# Patient Record
Sex: Male | Born: 1955
Health system: Southern US, Community
[De-identification: ages and names within clinical notes are randomized; demographics above are authoritative.]

## PROBLEM LIST (undated history)

## (undated) DIAGNOSIS — R12 Heartburn: Secondary | ICD-10-CM

## (undated) DIAGNOSIS — R972 Elevated prostate specific antigen [PSA]: Secondary | ICD-10-CM

## (undated) DIAGNOSIS — C439 Malignant melanoma of skin, unspecified: Secondary | ICD-10-CM

## (undated) DIAGNOSIS — N400 Enlarged prostate without lower urinary tract symptoms: Secondary | ICD-10-CM

## (undated) DIAGNOSIS — R319 Hematuria, unspecified: Secondary | ICD-10-CM

## (undated) DIAGNOSIS — M1990 Unspecified osteoarthritis, unspecified site: Secondary | ICD-10-CM

## (undated) DIAGNOSIS — I10 Essential (primary) hypertension: Secondary | ICD-10-CM

## (undated) HISTORY — DX: Elevated prostate specific antigen (PSA): R97.20

## (undated) HISTORY — DX: Heartburn: R12

## (undated) HISTORY — PX: MOHS SURGERY: SUR867

## (undated) HISTORY — DX: Benign prostatic hyperplasia without lower urinary tract symptoms: N40.0

## (undated) HISTORY — DX: Hematuria, unspecified: R31.9

## (undated) HISTORY — DX: Essential (primary) hypertension: I10

## (undated) HISTORY — DX: Malignant melanoma of skin, unspecified: C43.9

## (undated) HISTORY — DX: Unspecified osteoarthritis, unspecified site: M19.90

---

## 2008-05-09 ENCOUNTER — Ambulatory Visit: Payer: Self-pay | Admitting: Unknown Physician Specialty

## 2012-07-04 DIAGNOSIS — Z85820 Personal history of malignant melanoma of skin: Secondary | ICD-10-CM | POA: Insufficient documentation

## 2012-09-02 ENCOUNTER — Ambulatory Visit: Payer: Self-pay | Admitting: Orthopedic Surgery

## 2013-10-08 ENCOUNTER — Ambulatory Visit: Payer: Self-pay | Admitting: Gastroenterology

## 2013-12-23 DIAGNOSIS — E7800 Pure hypercholesterolemia, unspecified: Secondary | ICD-10-CM | POA: Insufficient documentation

## 2013-12-23 DIAGNOSIS — IMO0001 Reserved for inherently not codable concepts without codable children: Secondary | ICD-10-CM | POA: Insufficient documentation

## 2013-12-23 DIAGNOSIS — E785 Hyperlipidemia, unspecified: Secondary | ICD-10-CM | POA: Insufficient documentation

## 2013-12-23 DIAGNOSIS — I10 Essential (primary) hypertension: Secondary | ICD-10-CM | POA: Insufficient documentation

## 2013-12-23 DIAGNOSIS — R1310 Dysphagia, unspecified: Secondary | ICD-10-CM | POA: Insufficient documentation

## 2013-12-23 DIAGNOSIS — M5136 Other intervertebral disc degeneration, lumbar region: Secondary | ICD-10-CM | POA: Insufficient documentation

## 2014-04-15 ENCOUNTER — Ambulatory Visit: Payer: Self-pay | Admitting: Physician Assistant

## 2014-04-24 ENCOUNTER — Ambulatory Visit: Payer: Self-pay | Admitting: Medical

## 2014-12-29 DIAGNOSIS — Z79899 Other long term (current) drug therapy: Secondary | ICD-10-CM | POA: Insufficient documentation

## 2015-09-06 DIAGNOSIS — M25559 Pain in unspecified hip: Secondary | ICD-10-CM | POA: Insufficient documentation

## 2015-11-28 ENCOUNTER — Encounter: Payer: Self-pay | Admitting: Urology

## 2015-11-28 ENCOUNTER — Ambulatory Visit (INDEPENDENT_AMBULATORY_CARE_PROVIDER_SITE_OTHER): Payer: 59 | Admitting: Urology

## 2015-11-28 VITALS — BP 131/89 | HR 80 | Ht 68.0 in | Wt 160.4 lb

## 2015-11-28 DIAGNOSIS — R310 Gross hematuria: Secondary | ICD-10-CM

## 2015-11-28 DIAGNOSIS — N401 Enlarged prostate with lower urinary tract symptoms: Secondary | ICD-10-CM

## 2015-11-28 DIAGNOSIS — N138 Other obstructive and reflux uropathy: Secondary | ICD-10-CM

## 2015-11-28 LAB — URINALYSIS, COMPLETE
BILIRUBIN UA: NEGATIVE
GLUCOSE, UA: NEGATIVE
KETONES UA: NEGATIVE
Leukocytes, UA: NEGATIVE
NITRITE UA: NEGATIVE
Protein, UA: NEGATIVE
SPEC GRAV UA: 1.02 (ref 1.005–1.030)
Urobilinogen, Ur: 0.2 mg/dL (ref 0.2–1.0)
pH, UA: 6.5 (ref 5.0–7.5)

## 2015-11-28 LAB — MICROSCOPIC EXAMINATION
BACTERIA UA: NONE SEEN
RBC, UA: >30 /hpf — AB (ref 0–?)

## 2015-11-28 NOTE — Progress Notes (Signed)
11/28/2015 3:52 PM   Brandon Mathews [birth year] [id number]  Referring provider: [name], NP Brandon Mathews [place], [place] [address]  Chief Complaint  Patient presents with  . Hematuria    referred by Brandon Mathews    HPI: Patient is a 60 -year-old Caucasian male who presents today as a referral from their PCP, Brandon Lange, NP, for gross hematuria.    Patient noted that after he started Celebrex 2 months ago he noted a light pink tinge to his urine and then it became darker.   His UA was positive for 10-50 RBCs per high-power field at his primary care office on 11/20/2015 which prompted his referral to [place].   He does not have a prior history of recurrent urinary tract infections, nephrolithiasis, trauma to the genitourinary tract, BPH or malignancies of the genitourinary tract.   He does not have a family medical history of nephrolithiasis, malignancies of the genitourinary tract or hematuria.   Today, he is having symptoms of hesitancy and intermittency.  He is not having straining to urinate, a weak urinary stream, frequent urination, urgency, dysuria, nocturia or incontinence.  Her UA today demonstrates > 30 RBC's/hpf.  He is not experiencing any suprapubic pain, abdominal pain or flank pain. He denies any recent fevers, chills, nausea or vomiting.   He has not had any recent imaging studies.   He is a former smoker, with a 30 ppd history.  Quit 5 years ago.  He is exposed to secondhand smoke.  He has not worked with [employer].   PMH: Past Medical History  Diagnosis Date  . Heartburn   . Hematuria   . Arthritis   . Melanoma ([place])   . HTN (hypertension)     Surgical History: Past Surgical History  Procedure Laterality Date  . Mohs surgery      Home Medications:    Medication List       This list is accurate as of: 11/28/15  3:52 PM.  Always use your most recent med list.               celecoxib 200 MG capsule  Commonly known as:  CELEBREX  Reported on 11/28/2015     CIALIS 20 MG tablet  Generic drug:  tadalafil  Take by mouth.     clotrimazole-betamethasone cream  Commonly known as:  LOTRISONE     lisinopril 10 MG tablet  Commonly known as:  PRINIVIL,ZESTRIL  Take by mouth.     MULTI-VITAMINS Tabs  Take by mouth. Reported on 11/28/2015        Allergies: No Known Allergies  Family History: Family History  Problem Relation Age of Onset  . Kidney disease Neg Hx   . Prostate cancer Neg Hx   . Breast cancer Sister     Social History:  reports that he has quit smoking. He does not have any smokeless tobacco history on file. He reports that he drinks alcohol. He reports that he does not use illicit drugs.  ROS: UROLOGY Frequent Urination?: No Hard to postpone urination?: No Burning/pain with urination?: No Get up at night to urinate?: No Leakage of urine?: No Urine stream starts and stops?: Yes Trouble starting stream?: Yes Do you have to strain to urinate?: No Blood in urine?: Yes Urinary tract infection?: No Sexually transmitted disease?: No Injury to kidneys or bladder?: No Painful intercourse?: No Weak stream?: Yes Erection problems?: Yes Penile pain?:  No  Gastrointestinal Nausea?: No Vomiting?: No Indigestion/heartburn?: No Diarrhea?: No Constipation?: No  Constitutional Fever: No Night sweats?: No Weight loss?: No Fatigue?: No  Skin Skin rash/lesions?: No Itching?: No  Eyes Blurred vision?: No Double vision?: No  Ears/Nose/Throat Sore throat?: No Sinus problems?: No  Hematologic/Lymphatic Swollen glands?: No Easy bruising?: No  Cardiovascular Leg swelling?: No Chest pain?: No  Respiratory Cough?: No Shortness of breath?: No  Endocrine Excessive thirst?: No  Musculoskeletal Back pain?: Yes Joint pain?: Yes  Neurological Headaches?: No Dizziness?: No  Psychologic Depression?: No Anxiety?:  Yes  Physical Exam: BP 131/89 mmHg  Pulse 80  Ht 5\' 8"  (1.727 m)  Wt 160 lb 6.4 oz (72.757 kg)  BMI 24.39 kg/m2  Constitutional: Well nourished. Alert and oriented, No acute distress. HEENT: [place] AT, moist mucus membranes. Trachea midline, no masses. Cardiovascular: No clubbing, cyanosis, or edema. Respiratory: Normal respiratory effort, no increased work of breathing. GI: Abdomen is soft, non tender, non distended, no abdominal masses. Liver and spleen not palpable.  No hernias appreciated.  Stool sample for occult testing is not indicated.   GU: No CVA tenderness.  No bladder fullness or masses.  Patient with circumcised phallus.   Urethral meatus is patent.  No penile discharge. No penile lesions or rashes. Scrotum without lesions, cysts, rashes and/or edema.  Testicles are located scrotally bilaterally. No masses are appreciated in the testicles. Left and right epididymis are normal. Rectal: Patient with  normal sphincter tone. Anus and perineum without scarring or rashes. No rectal masses are appreciated. Prostate is approximately 60 grams, no nodules are appreciated. Seminal vesicles are normal. Skin: No rashes, bruises or suspicious lesions. Lymph: No cervical or inguinal adenopathy. Neurologic: Grossly intact, no focal deficits, moving all 4 extremities. Psychiatric: Normal mood and affect.  Laboratory Data:  Urinalysis Results for orders placed or performed in visit on 11/28/15  CULTURE, URINE COMPREHENSIVE  Result Value Ref Range   Urine Culture, Comprehensive Final report    Result 1 Comment   Microscopic Examination  Result Value Ref Range   WBC, UA 0-5 0 -  5 /hpf   RBC, UA >30 (A) 0 -  2 /hpf   Epithelial Cells (non renal) 0-10 0 - 10 /hpf   Bacteria, UA None seen None seen/Few  Urinalysis, Complete  Result Value Ref Range   Specific Gravity, UA 1.020 1.005 - 1.030   pH, UA 6.5 5.0 - 7.5   Color, UA Yellow Yellow   Appearance Ur Clear Clear   Leukocytes, UA  Negative Negative   Protein, UA Negative Negative/Trace   Glucose, UA Negative Negative   Ketones, UA Negative Negative   RBC, UA 3+ (A) Negative   Bilirubin, UA Negative Negative   Urobilinogen, Ur 0.2 0.2 - 1.0 mg/dL   Nitrite, UA Negative Negative   Microscopic Examination See below:   BUN+Creat  Result Value Ref Range   BUN 21 6 - 24 mg/dL   Creatinine, Ser 1.15 0.76 - 1.27 mg/dL   GFR calc non Af Amer 70 >59 mL/min/1.73   GFR calc Af Amer 81 >59 mL/min/1.73   BUN/Creatinine Ratio 18 9 - 20  PSA  Result Value Ref Range   Prostate Specific Ag, Serum 2.6 0.0 - 4.0 ng/mL     Assessment & Plan:    1. Gross hematuria:    I explained to the patient that there are a number of causes that can be associated with blood in the urine, such as stones, BPH,  UTI's, damage to the urinary tract and/or cancer.  At this time, I felt that the patient warranted further urologic evaluation.   The AUA guidelines state that a CT urogram is the preferred imaging study to evaluate hematuria.  I explained to the patient that a contrast material will be injected into a vein and that in rare instances, an allergic reaction can result and may even life threatening   The patient denies any allergies to contrast, iodine and/or seafood and is not taking metformin.  Following the imaging study,  I've recommended a cystoscopy. I described how this is performed, typically in an office setting with a flexible cystoscope. We described the risks, benefits, and possible side effects, the most common of which is a minor amount of blood in the urine and/or burning which usually resolves in 24 to 48 hours.    The patient had the opportunity to ask questions which were answered. Based upon this discussion, the patient is willing to proceed. Therefore, I've ordered: a CT Urogram and cystoscopy.  He will return following all of the above for discussion of the results.     - Urinalysis, Complete - CULTURE, URINE  COMPREHENSIVE - BUN+Creat  2. BPH with LUTS  -I discussed with the patient that the AUA panel feels that in men age 44 to 45 years, there was sufficient certainty that the benefits of screening could outweigh the harms.  He agrees to a PSA at this time.    -PSA  - readdress when hematuria work up is complete    Return for CT Urogram report and cystoscopy.  These notes generated with voice recognition software. I apologize for typographical errors.  Zara Council, [facility] [address] [place], [place] [address] [phone]

## 2015-11-28 NOTE — Patient Instructions (Signed)
Hematuria, Adult  Hematuria is blood in your urine. It can be caused by a bladder infection, kidney infection, prostate infection, kidney stone, or cancer of your urinary tract. Infections can usually be treated with medicine, and a kidney stone usually will pass through your urine. If neither of these is the cause of your hematuria, further workup to find out the reason may be needed.  It is very important that you tell your health care provider about any blood you see in your urine, even if the blood stops without treatment or happens without causing pain. Blood in your urine that happens and then stops and then happens again can be a symptom of a very serious condition. Also, pain is not a symptom in the initial stages of many urinary cancers.  HOME CARE INSTRUCTIONS    Drink lots of fluid, 3-4 quarts a day. If you have been diagnosed with an infection, cranberry juice is especially recommended, in addition to large amounts of water.   Avoid caffeine, tea, and carbonated beverages because they tend to irritate the bladder.   Avoid alcohol because it may irritate the prostate.   Take all medicines as directed by your health care provider.   If you were prescribed an antibiotic medicine, finish it all even if you start to feel better.   If you have been diagnosed with a kidney stone, follow your health care provider's instructions regarding straining your urine to catch the stone.   Empty your bladder often. Avoid holding urine for long periods of time.   After a bowel movement, women should cleanse front to back. Use each tissue only once.   Empty your bladder before and after sexual intercourse if you are a male.  SEEK MEDICAL CARE IF:   You develop back pain.   You have a fever.   You have a feeling of sickness in your stomach (nausea) or vomiting.   Your symptoms are not better in 3 days. Return sooner if you are getting worse.  SEEK IMMEDIATE MEDICAL CARE IF:    You develop severe vomiting and  are unable to keep the medicine down.   You develop severe back or abdominal pain despite taking your medicines.   You begin passing a large amount of blood or clots in your urine.   You feel extremely weak or faint, or you pass out.  MAKE SURE YOU:    Understand these instructions.   Will watch your condition.   Will get help right away if you are not doing well or get worse.     This information is not intended to replace advice given to you by your health care provider. Make sure you discuss any questions you have with your health care provider.     Document Released: 05/20/2005 Document Revised: 06/10/2014 Document Reviewed: 01/18/2013  Elsevier Interactive Patient Education 2016 [employer]  Hematuria, Adult  Hematuria is blood in your urine. It can be caused by a bladder infection, kidney infection, prostate infection, kidney stone, or cancer of your urinary tract. Infections can usually be treated with medicine, and a kidney stone usually will pass through your urine. If neither of these is the cause of your hematuria, further workup to find out the reason may be needed.  It is very important that you tell your health care provider about any blood you see in your urine, even if the blood stops without treatment or happens without causing pain. Blood in your urine that happens and then stops   of fluid, 3-4 quarts a day. If you have been diagnosed with an infection, cranberry juice is especially recommended, in addition to large amounts of water.  Avoid caffeine, tea, and carbonated beverages because they tend to irritate the bladder.  Avoid alcohol because it may irritate the prostate.  Take all medicines as directed by your health care provider.  If you were prescribed an antibiotic medicine, finish it all even if  you start to feel better.  If you have been diagnosed with a kidney stone, follow your health care provider's instructions regarding straining your urine to catch the stone.  Empty your bladder often. Avoid holding urine for long periods of time.  After a bowel movement, women should cleanse front to back. Use each tissue only once.  Empty your bladder before and after sexual intercourse if you are a male. SEEK MEDICAL CARE IF:  You develop back pain.  You have a fever.  You have a feeling of sickness in your stomach (nausea) or vomiting.  Your symptoms are not better in 3 days. Return sooner if you are getting worse. SEEK IMMEDIATE MEDICAL CARE IF:   You develop severe vomiting and are unable to keep the medicine down.  You develop severe back or abdominal pain despite taking your medicines.  You begin passing a large amount of blood or clots in your urine.  You feel extremely weak or faint, or you pass out. MAKE SURE YOU:   Understand these instructions.  Will watch your condition.  Will get help right away if you are not doing well or get worse.   This information is not intended to replace advice given to you by your health care provider. Make sure you discuss any questions you have with your health care provider.   Document Released: 05/20/2005 Document Revised: 06/10/2014 Document Reviewed: 01/18/2013 Elsevier Interactive Patient Education 2016 [facility] Scan A computed tomography (CT) scan is a specialized X-ray scan. It uses X-rays and a computer to make pictures of different areas of your body. A CT scan can offer more detailed information than a regular X-ray exam. The CT scan provides data about internal organs, soft tissue structures, blood vessels, and bones.  The CT scanner is a large machine that takes pictures of your body as you move through the opening.  LET [facility] CARE PROVIDER KNOW ABOUT:  Any allergies you have.   All medicines you  are taking, including vitamins, herbs, eye drops, creams, and over-the-counter medicines.   Previous problems you or members of your family have had with the use of anesthetics.   Any blood disorders you have.   Previous surgeries you have had.   Medical conditions you have. RISKS AND COMPLICATIONS  Generally, this is a safe procedure. However, as with any procedure, problems can occur. Possible problems include:   An allergic reaction to the contrast material.   Development of cancer from excessive exposure to radiation. The risk of this is small.  BEFORE THE PROCEDURE   The day before the test, stop drinking caffeinated beverages. These include energy drinks, tea, soda, coffee, and hot chocolate.   On the day of the test:  About 4 hours before the test, stop eating and drinking anything but water as advised by your health care provider.   Avoid wearing jewelry. You will have to partly or fully undress and wear a hospital gown. PROCEDURE   You will be asked to lie on a table with your arms above your head.  If contrast dye is to be used for the test, an IV tube will be inserted in your arm. The contrast dye will be injected into the IV tube. You might feel warm, or you may get a metallic taste in your mouth.   The table you will be lying on will move into a large machine that will do the scanning.   You will be able to see, hear, and talk to the person running the machine while you are in it. Follow that person's directions.   The CT machine will move around you to take pictures. Do not move while it is scanning. This helps to get a good image.   When the best possible pictures have been taken, the machine will be turned off. The table will be moved out of the machine. The IV tube will then be removed. AFTER THE PROCEDURE  Ask your health care provider when to follow up for your test results.   This information is not intended to replace advice given to you by  your health care provider. Make sure you discuss any questions you have with your health care provider.   Document Released: 06/27/2004 Document Revised: 05/25/2013 Document Reviewed: 01/25/2013 Elsevier Interactive Patient Education 2016 [place] Scan A computed tomography (CT) scan is a specialized X-ray scan. It uses X-rays and a computer to make pictures of different areas of your body. A CT scan can offer more detailed information than a regular X-ray exam. The CT scan provides data about internal organs, soft tissue structures, blood vessels, and bones.  The CT scanner is a large machine that takes pictures of your body as you move through the opening.  LET [facility] CARE PROVIDER KNOW ABOUT:  Any allergies you have.   All medicines you are taking, including vitamins, herbs, eye drops, creams, and over-the-counter medicines.   Previous problems you or members of your family have had with the use of anesthetics.   Any blood disorders you have.   Previous surgeries you have had.   Medical conditions you have. RISKS AND COMPLICATIONS  Generally, this is a safe procedure. However, as with any procedure, problems can occur. Possible problems include:   An allergic reaction to the contrast material.   Development of cancer from excessive exposure to radiation. The risk of this is small.  BEFORE THE PROCEDURE   The day before the test, stop drinking caffeinated beverages. These include energy drinks, tea, soda, coffee, and hot chocolate.   On the day of the test:  About 4 hours before the test, stop eating and drinking anything but water as advised by your health care provider.   Avoid wearing jewelry. You will have to partly or fully undress and wear a hospital gown. PROCEDURE   You will be asked to lie on a table with your arms above your head.   If contrast dye is to be used for the test, an IV tube will be inserted in your arm. The contrast dye will be  injected into the IV tube. You might feel warm, or you may get a metallic taste in your mouth.   The table you will be lying on will move into a large machine that will do the scanning.   You will be able to see, hear, and talk to the person running the machine while you are in it. Follow that person's directions.   The CT machine will move around you to take pictures. Do not move while it  is scanning. This helps to get a good image.   When the best possible pictures have been taken, the machine will be turned off. The table will be moved out of the machine. The IV tube will then be removed. AFTER THE PROCEDURE  Ask your health care provider when to follow up for your test results.   This information is not intended to replace advice given to you by your health care provider. Make sure you discuss any questions you have with your health care provider.   Document Released: 06/27/2004 Document Revised: 05/25/2013 Document Reviewed: 01/25/2013 Elsevier Interactive Patient Education [employer]. Cystoscopy Cystoscopy is a procedure that is used to help your caregiver diagnose and sometimes treat conditions that affect your lower urinary tract. Your lower urinary tract includes your bladder and the tube through which urine passes from your bladder out of your body (urethra). Cystoscopy is performed with a thin, tube-shaped instrument (cystoscope). The cystoscope has lenses and a light at the end so that your caregiver can see inside your bladder. The cystoscope is inserted at the entrance of your urethra. Your caregiver guides it through your urethra and into your bladder. There are two main types of cystoscopy:  Flexible cystoscopy (with a flexible cystoscope).  Rigid cystoscopy (with a rigid cystoscope). Cystoscopy may be recommended for many conditions, including:  Urinary tract infections.  Blood in your urine (hematuria).  Loss of bladder control (urinary incontinence) or  overactive bladder.  Unusual cells found in a urine sample.  Urinary blockage.  Painful urination. Cystoscopy may also be done to remove a sample of your tissue to be checked under a microscope (biopsy). It may also be done to remove or destroy bladder stones. LET YOUR CAREGIVER KNOW ABOUT:  Allergies to food or medicine.  Medicines taken, including vitamins, herbs, eyedrops, over-the-counter medicines, and creams.  Use of steroids (by mouth or creams).  Previous problems with anesthetics or numbing medicines.  History of bleeding problems or blood clots.  Previous surgery.  Other health problems, including diabetes and kidney problems.  Possibility of pregnancy, if this applies. PROCEDURE The area around the opening to your urethra will be cleaned. A medicine to numb your urethra (local anesthetic) is used. If a tissue sample or stone is removed during the procedure, you may be given a medicine to make you sleep (general anesthetic). Your caregiver will gently insert the tip of the cystoscope into your urethra. The cystoscope will be slowly glided through your urethra and into your bladder. Sterile fluid will flow through the cystoscope and into your bladder. The fluid will expand and stretch your bladder. This gives your caregiver a better view of your bladder walls. The procedure lasts about 15-20 minutes. AFTER THE PROCEDURE If a local anesthetic is used, you will be allowed to go home as soon as you are ready. If a general anesthetic is used, you will be taken to a recovery area until you are stable. You may have temporary bleeding and burning on urination.   This information is not intended to replace advice given to you by your health care provider. Make sure you discuss any questions you have with your health care provider.   Document Released: 05/17/2000 Document Revised: 06/10/2014 Document Reviewed: 11/11/2011 Elsevier Interactive Patient Education [employer].

## 2015-11-29 LAB — BUN+CREAT
BUN / CREAT RATIO: 18 (ref 9–20)
BUN: 21 mg/dL (ref 6–24)
CREATININE: 1.15 mg/dL (ref 0.76–1.27)
GFR, EST AFRICAN AMERICAN: 81 mL/min/{1.73_m2} (ref >59)
GFR, EST NON AFRICAN AMERICAN: 70 mL/min/{1.73_m2} (ref >59)

## 2015-11-29 LAB — PSA: PROSTATE SPECIFIC AG, SERUM: 2.6 ng/mL (ref 0.0–4.0)

## 2015-11-30 LAB — CULTURE, URINE COMPREHENSIVE

## 2015-12-01 ENCOUNTER — Telehealth: Payer: Self-pay

## 2015-12-01 DIAGNOSIS — N138 Other obstructive and reflux uropathy: Secondary | ICD-10-CM | POA: Insufficient documentation

## 2015-12-01 DIAGNOSIS — N401 Enlarged prostate with lower urinary tract symptoms: Secondary | ICD-10-CM

## 2015-12-01 NOTE — Telephone Encounter (Signed)
-----   Message from Nori Riis, PA-C sent at 11/29/2015 10:41 AM EDT ----- Patient's labs are normal.

## 2015-12-01 NOTE — Telephone Encounter (Signed)
LMOM-most recent labs are normal.  

## 2015-12-08 ENCOUNTER — Ambulatory Visit
Admission: RE | Admit: 2015-12-08 | Discharge: 2015-12-08 | Disposition: A | Discharge status: Home / Self Care | Payer: 59 | Source: Ambulatory Visit | Attending: Urology | Admitting: Urology

## 2015-12-08 DIAGNOSIS — I700 Atherosclerosis of aorta: Secondary | ICD-10-CM | POA: Insufficient documentation

## 2015-12-08 DIAGNOSIS — N281 Cyst of kidney, acquired: Secondary | ICD-10-CM | POA: Diagnosis not present

## 2015-12-08 DIAGNOSIS — M419 Scoliosis, unspecified: Secondary | ICD-10-CM | POA: Diagnosis not present

## 2015-12-08 DIAGNOSIS — N400 Enlarged prostate without lower urinary tract symptoms: Secondary | ICD-10-CM | POA: Diagnosis not present

## 2015-12-08 DIAGNOSIS — R310 Gross hematuria: Secondary | ICD-10-CM | POA: Diagnosis not present

## 2015-12-08 MED ORDER — IOPAMIDOL (ISOVUE-370) INJECTION 76%
125.0000 mL | Freq: Once | INTRAVENOUS | Status: AC | PRN
  Administered 2015-12-08: 125 mL via INTRAVENOUS

## 2015-12-11 ENCOUNTER — Ambulatory Visit: Payer: Self-pay

## 2015-12-22 ENCOUNTER — Encounter: Payer: Self-pay | Admitting: Urology

## 2015-12-22 ENCOUNTER — Ambulatory Visit (INDEPENDENT_AMBULATORY_CARE_PROVIDER_SITE_OTHER): Payer: 59 | Admitting: Urology

## 2015-12-22 VITALS — BP 118/75 | HR 74 | Ht 66.0 in | Wt 156.9 lb

## 2015-12-22 DIAGNOSIS — N401 Enlarged prostate with lower urinary tract symptoms: Secondary | ICD-10-CM

## 2015-12-22 DIAGNOSIS — C4490 Unspecified malignant neoplasm of skin, unspecified: Secondary | ICD-10-CM | POA: Insufficient documentation

## 2015-12-22 DIAGNOSIS — K589 Irritable bowel syndrome without diarrhea: Secondary | ICD-10-CM | POA: Insufficient documentation

## 2015-12-22 DIAGNOSIS — Z125 Encounter for screening for malignant neoplasm of prostate: Secondary | ICD-10-CM

## 2015-12-22 DIAGNOSIS — K219 Gastro-esophageal reflux disease without esophagitis: Secondary | ICD-10-CM | POA: Insufficient documentation

## 2015-12-22 DIAGNOSIS — N138 Other obstructive and reflux uropathy: Secondary | ICD-10-CM

## 2015-12-22 DIAGNOSIS — R310 Gross hematuria: Secondary | ICD-10-CM | POA: Diagnosis not present

## 2015-12-22 DIAGNOSIS — N529 Male erectile dysfunction, unspecified: Secondary | ICD-10-CM | POA: Insufficient documentation

## 2015-12-22 LAB — URINALYSIS, COMPLETE
BILIRUBIN UA: NEGATIVE
Glucose, UA: NEGATIVE
Ketones, UA: NEGATIVE
Leukocytes, UA: NEGATIVE
Nitrite, UA: NEGATIVE
PH UA: 5 (ref 5.0–7.5)
Specific Gravity, UA: >1.03 — ABNORMAL HIGH (ref 1.005–1.030)
UUROB: 0.2 mg/dL (ref 0.2–1.0)

## 2015-12-22 LAB — MICROSCOPIC EXAMINATION: EPITHELIAL CELLS (NON RENAL): NONE SEEN /HPF (ref 0–10)

## 2015-12-22 MED ORDER — LIDOCAINE HCL 2 % EX GEL
1.0000 "application " | Freq: Once | CUTANEOUS | Status: AC
  Administered 2015-12-22: 1 via URETHRAL

## 2015-12-22 MED ORDER — FINASTERIDE 5 MG PO TABS: 5.0000 mg | ORAL_TABLET | Freq: Every day | ORAL | Status: DC

## 2015-12-22 MED ORDER — CIPROFLOXACIN HCL 500 MG PO TABS
500.0000 mg | ORAL_TABLET | Freq: Once | ORAL | Status: AC
  Administered 2015-12-22: 500 mg via ORAL

## 2015-12-22 MED ORDER — TAMSULOSIN HCL 0.4 MG PO CAPS: 0.4000 mg | ORAL_CAPSULE | Freq: Every day | ORAL | Status: DC

## 2015-12-22 NOTE — Progress Notes (Signed)
12/22/2015 11:49 AM   Brandon Mathews 11/22/1955 [id number]  Referring provider: [name], NP [address] [facility] [place], [place] [address]  Chief Complaint  Patient presents with  . Cysto    gross hematuria    HPI: The patient today presents for evaluation and completion of his gross hematuria workup.  He has no new complaints at this time. He is voiding well. He denies dysuria. He denies further hematuria. Urinalysis shows resolution of hematuria.  The CT hematuria workup was essentially unremarkable.  Recent PSA was 2.6.  He also today would like to discuss his urinary implants. He has nocturia 0-1 times per night. He does not have the ability to fully empty his bladder. He does note frequency and a weak stream. He is unsure if he was discharged medications this time.  PMH: Past Medical History  Diagnosis Date  . Heartburn   . Hematuria   . Arthritis   . Melanoma ([place])   . HTN (hypertension)     Surgical History: Past Surgical History  Procedure Laterality Date  . Mohs surgery      Home Medications:    Medication List       This list is accurate as of: 12/22/15 11:49 AM.  Always use your most recent med list.               celecoxib 200 MG capsule  Commonly known as:  CELEBREX  Reported on 12/22/2015     CIALIS 20 MG tablet  Generic drug:  tadalafil  Take by mouth. Reported on 12/22/2015     clotrimazole-betamethasone cream  Commonly known as:  LOTRISONE     finasteride 5 MG tablet  Commonly known as:  PROSCAR  Take 1 tablet (5 mg total) by mouth daily.     lisinopril 10 MG tablet  Commonly known as:  PRINIVIL,ZESTRIL  Take by mouth.     MULTI-VITAMINS Tabs  Take by mouth. Reported on 11/28/2015     tamsulosin 0.4 MG Caps capsule  Commonly known as:  FLOMAX  Take 1 capsule (0.4 mg total) by mouth daily.        Allergies: No Known Allergies  Family History: Family History  Problem  Relation Age of Onset  . Kidney disease Neg Hx   . Prostate cancer Neg Hx   . Breast cancer Sister     Social History:  reports that he has quit smoking. He does not have any smokeless tobacco history on file. He reports that he drinks alcohol. He reports that he does not use illicit drugs.  ROS:                                        Physical Exam: BP 118/75 mmHg  Pulse 74  Ht 5\' 6"  (1.676 m)  Wt 156 lb 14.4 oz (71.169 kg)  BMI 25.34 kg/m2  Constitutional:  Alert and oriented, No acute distress. HEENT: [place] AT, moist mucus membranes.  Trachea midline, no masses. Cardiovascular: No clubbing, cyanosis, or edema. Respiratory: Normal respiratory effort, no increased work of breathing. GI: Abdomen is soft, nontender, nondistended, no abdominal masses GU: No CVA tenderness.  Skin: No rashes, bruises or suspicious lesions. Lymph: No cervical or inguinal adenopathy. Neurologic: Grossly intact, no focal deficits, moving all 4 extremities. Psychiatric: Normal mood and affect.  Laboratory Data: No results found for:  WBC, HGB, HCT, MCV, PLT  Lab Results  Component Value Date   CREATININE 1.15 11/28/2015    No results found for: PSA  No results found for: TESTOSTERONE  No results found for: HGBA1C  Urinalysis    Component Value Date/Time   APPEARANCEUR Clear 11/28/2015 1506   GLUCOSEU Negative 11/28/2015 1506   BILIRUBINUR Negative 11/28/2015 1506   PROTEINUR Negative 11/28/2015 1506   NITRITE Negative 11/28/2015 1506   LEUKOCYTESUR Negative 11/28/2015 1506    Pertinent Imaging:  CLINICAL DATA: Hematuria workup. Hematuria x2 since April  EXAM: CT ABDOMEN AND PELVIS WITHOUT AND WITH CONTRAST  TECHNIQUE: Multidetector CT imaging of the abdomen and pelvis was performed following the standard protocol before and following the bolus administration of intravenous contrast.  CONTRAST: 125 cc of Isovue 370  COMPARISON:  None.  FINDINGS: Lower chest: No pleural fluid. The lung bases appear clear.  Hepatobiliary: There is no focal liver abnormality. The gallbladder is within normal limits. No biliary dilatation.  Pancreas: Unremarkable appearance of the pancreas.  Spleen: Normal appearance of the spleen.  Adrenals/Urinary Tract: The adrenal glands appear normal. There is no right renal calculi identified. Cyst arising from the inferior pole of the left kidney is identified measuring 3.7 cm, image 41 of series 4. Several additional cysts are noted within the left kidney. Arising from the upper pole of the left kidney is a hyperdense structure which measures 8 mm. This is too small to reliably characterize but measures approximately 65 Hounsfield units on the precontrast images. No kidney stones identified. No bladder calculi or ureteral calculi noted. No hydronephrosis. On the delayed images there is symmetric excretion of contrast material by both kidneys.  Stomach/Bowel: The stomach appears normal. The small bowel loops have a normal course and caliber. Without evidence for obstruction. The appendix is visualized and appears normal. Distal colonic diverticula noted without acute inflammation. No pathologic dilatation of the colon.  Vascular/Lymphatic: Calcified atherosclerotic disease involves the abdominal aorta. No aneurysm. No enlarged retroperitoneal or mesenteric adenopathy. No enlarged pelvic or inguinal lymph nodes.  Reproductive: Prostate gland is enlarged. Symmetric enlargement of the seminal vesicles.  Other: No ascites identified. No focal fluid collections.  Musculoskeletal: No aggressive lytic or sclerotic bone lesions. There is a scoliosis deformity which is convex towards the left. Degenerative disc disease noted within the lower lumbar spine.  IMPRESSION: 1. No acute findings within the abdomen or pelvis. No nephrolithiasis or hydronephrosis. 2. Prostate  gland enlargement. 3. Kidney cysts. 4. There is a hyperdense structure arising from the upper pole the left kidney which is too small to reliably characterize measuring 8 mm. 5. Aortic atherosclerosis 6. Scoliosis and degenerative disc disease.     Cystoscopy Procedure Note  Patient identification was confirmed, informed consent was obtained, and patient was prepped using Betadine solution.  Lidocaine jelly was administered per urethral meatus.    Preoperative abx where received prior to procedure.     Pre-Procedure: - Inspection reveals a normal caliber ureteral meatus.  Procedure: The flexible cystoscope was introduced without difficulty - No urethral strictures/lesions are present. - Enlarged prostate  -visually obstructive - Normal bladder neck - Bilateral ureteral orifices identified - Bladder mucosa  reveals no ulcers, tumors, or lesions - No bladder stones - No trabeculation  Retroflexion shows intravesical lobe   Post-Procedure: - Patient tolerated the procedure well    Assessment & Plan:    1. Gross hematuria -Negative work up. Repeat urinalysis in one year.  2. Prostate cancer screening Up-to-date.  Due for PSA DRE in one year.  3. BPH I think the patient will be best served starting finasteride and Flomax. He was warned of the risks and benefits of size medications which include retrograde ejaculation and orthostatic hypotension with Flomax. He is also aware of the risk of decreased libido and theoretical risk of increased prostate cancer with finasteride. He still unsure if he will start his medications. For now, we'll send him to the pharmacy and he will decide if he wants to start them. I think he would benefit the most from dual therapy given his intravesical prostate lobe and large prostate on rectal exam.   Return in about 1 year (around 12/21/2016) for repeat urinalysis.  Nickie Retort, MD  [facility] [address] [place], [place] [address] [phone]

## 2016-06-01 ENCOUNTER — Encounter: Payer: Self-pay | Admitting: *Deleted

## 2016-06-01 ENCOUNTER — Ambulatory Visit
Admission: EM | Admit: 2016-06-01 | Discharge: 2016-06-01 | Disposition: A | Discharge status: Home / Self Care | Payer: 59 | Attending: Family Medicine | Admitting: Family Medicine

## 2016-06-01 DIAGNOSIS — J209 Acute bronchitis, unspecified: Secondary | ICD-10-CM

## 2016-06-01 DIAGNOSIS — J018 Other acute sinusitis: Secondary | ICD-10-CM | POA: Diagnosis not present

## 2016-06-01 MED ORDER — HYDROCOD POLST-CPM POLST ER 10-8 MG/5ML PO SUER: 5.0000 mL | Freq: Two times a day (BID) | ORAL | 0 refills | Status: DC | PRN

## 2016-06-01 MED ORDER — ALBUTEROL SULFATE HFA 108 (90 BASE) MCG/ACT IN AERS: 2.0000 | INHALATION_SPRAY | RESPIRATORY_TRACT | 0 refills | Status: DC | PRN

## 2016-06-01 MED ORDER — FEXOFENADINE-PSEUDOEPHED ER 180-240 MG PO TB24: 1.0000 | ORAL_TABLET | Freq: Every day | ORAL | 0 refills | Status: DC

## 2016-06-01 MED ORDER — CEFUROXIME AXETIL 500 MG PO TABS: 500.0000 mg | ORAL_TABLET | Freq: Two times a day (BID) | ORAL | 0 refills | Status: DC

## 2016-06-01 NOTE — ED Triage Notes (Signed)
Dx with sinusitis 3 weeks ago and rx amoxicillin. Now c/o productive cough- yellow and chest congestion. Chills and body aches.

## 2016-06-01 NOTE — ED Provider Notes (Signed)
[facility]    CSN: [id number] Arrival date & time: 06/01/16  0801     History   Chief Complaint Chief Complaint  Patient presents with  . Cough    HPI Brandon Mathews is a 60 y.o. male.   Patient's 60 year old white male history of sinus congestion about 3 weeks ago. He states he was seen by his PCP associate and placed on amoxicillin after as he put it he looked in his ear and to be laid new they had a sinus infection. He states the amoxicillin seemed to help initially but he never really felt like he got over the infection and URI completely and now seems like his mood and to his chest he is coughing a lot especially at night and difficulty resting at night as well he states that he is on Mucinex DM around-the-clock basis only way he can get a sleep last night was worse and he is coughing up yellowish green phlegm as well as having pressure behind his eyes. He does not smoke now he is a former smoker. He's had melanoma hypertension and arthritis. No known drug allergies. With further questioning whether he is having any wheezing he reports that when he is coughing up the yellowish green sputum is occasionally having some wheezing as well   The history is provided by the patient. No language interpreter was used.  Cough  Cough characteristics:  Productive Sputum characteristics:  Green and yellow Severity:  Moderate Timing:  Constant Progression:  Worsening Chronicity:  New Context: smoke exposure and upper respiratory infection   Relieved by:  Nothing Worsened by:  Lying down Ineffective treatments:  Cough suppressants Risk factors: recent infection     Past Medical History:  Diagnosis Date  . Arthritis   . Heartburn   . Hematuria   . HTN (hypertension)   . Melanoma [facility])     Patient Active Problem List   Diagnosis Date Noted  . ED (erectile dysfunction) of organic origin 12/22/2015  . Acid reflux 12/22/2015  . Adaptive colitis 12/22/2015  . CA of  skin 12/22/2015  . BPH with obstruction/lower urinary tract symptoms 12/01/2015  . Arthralgia of hip 09/06/2015  . Other long term (current) drug therapy 12/29/2014  . Degeneration of intervertebral disc of lumbar region 12/23/2013  . Can't get food down 12/23/2013  . Benign essential HTN 12/23/2013  . Hypercholesterolemia 12/23/2013  . H/O Malignant melanoma 07/04/2012    Past Surgical History:  Procedure Laterality Date  . MOHS SURGERY         Home Medications    Prior to Admission medications   Medication Sig Start Date End Date Taking? Authorizing Provider  clotrimazole-betamethasone (LOTRISONE) cream  12/10/13  Yes Historical Provider, MD  finasteride (PROSCAR) 5 MG tablet Take 1 tablet (5 mg total) by mouth daily. 12/22/15  Yes Nickie Retort, MD  lisinopril (PRINIVIL,ZESTRIL) 10 MG tablet Take by mouth. 12/29/14  Yes Historical Provider, MD  Multiple Vitamin (MULTI-VITAMINS) TABS Take by mouth. Reported on 11/28/2015   Yes Historical Provider, MD  tamsulosin (FLOMAX) 0.4 MG CAPS capsule Take 1 capsule (0.4 mg total) by mouth daily. 12/22/15  Yes Nickie Retort, MD  albuterol (PROVENTIL HFA;VENTOLIN HFA) 108 (90 Base) MCG/ACT inhaler Inhale 2 puffs into the lungs every 4 (four) hours as needed for wheezing or shortness of breath. 06/01/16   Frederich Cha, MD  cefUROXime (CEFTIN) 500 MG tablet Take 1 tablet (500 mg total) by mouth 2 (two) times daily. 06/01/16  Frederich Cha, MD  celecoxib (CELEBREX) 200 MG capsule Reported on 12/22/2015 09/06/15   Historical Provider, MD  chlorpheniramine-HYDROcodone (TUSSIONEX PENNKINETIC ER) 10-8 MG/5ML SUER Take 5 mLs by mouth every 12 (twelve) hours as needed for cough. 06/01/16   Frederich Cha, MD  fexofenadine-pseudoephedrine (ALLEGRA-D ALLERGY & CONGESTION) 180-240 MG 24 hr tablet Take 1 tablet by mouth daily. 06/01/16   Frederich Cha, MD  tadalafil (CIALIS) 20 MG tablet Take by mouth. Reported on 12/22/2015 12/29/14   Historical Provider, MD     Family History Family History  Problem Relation Age of Onset  . Breast cancer Sister   . Kidney disease Neg Hx   . Prostate cancer Neg Hx     Social History Social History  Substance Use Topics  . Smoking status: Former Research scientist (life sciences)  . Smokeless tobacco: Never Used     Comment: quit 15 years  . Alcohol use 0.0 oz/week     Comment: social     Allergies   Patient has no known allergies.   Review of Systems Review of Systems  HENT: Positive for sinus pain and sinus pressure.   Respiratory: Positive for cough.   All other systems reviewed and are negative.    Physical Exam Triage Vital Signs ED Triage Vitals  Enc Vitals Group     BP 06/01/16 0818 (!) 116/58     Pulse Rate 06/01/16 0818 79     Resp 06/01/16 0818 16     Temp 06/01/16 0818 98 F (36.7 C)     Temp Source 06/01/16 0818 Oral     SpO2 06/01/16 0818 97 %     Weight 06/01/16 0819 167 lb (75.8 kg)     Height 06/01/16 0819 5\' 8"  (1.727 m)     Head Circumference --      Peak Flow --      Pain Score 06/01/16 0910 0     Pain Loc --      Pain Edu? --      Excl. in [place]? --    No data found.   Updated Vital Signs BP (!) 116/58 (BP Location: Left Arm)   Pulse 79   Temp 98 F (36.7 C) (Oral)   Resp 16   Ht 5\' 8"  (1.727 m)   Wt 167 lb (75.8 kg)   SpO2 97%   BMI 25.39 kg/m   Visual Acuity Right Eye Distance:   Left Eye Distance:   Bilateral Distance:    Right Eye Near:   Left Eye Near:    Bilateral Near:     Physical Exam  Constitutional: He is oriented to person, place, and time. He appears well-developed and well-nourished.  HENT:  Head: Normocephalic and atraumatic.  Right Ear: Hearing, tympanic membrane, external ear and ear canal normal.  Left Ear: Hearing, tympanic membrane, external ear and ear canal normal.  Nose: Mucosal edema present. Right sinus exhibits maxillary sinus tenderness. Left sinus exhibits maxillary sinus tenderness.  Mouth/Throat: Uvula is midline. No oropharyngeal  exudate, posterior oropharyngeal edema or posterior oropharyngeal erythema.  Eyes: EOM are normal. Pupils are equal, round, and reactive to light.  Neck: Normal range of motion. Neck supple.  Cardiovascular: Normal rate, regular rhythm and normal heart sounds.   Pulmonary/Chest: Effort normal and breath sounds normal.  Musculoskeletal: Normal range of motion.  Neurological: He is alert and oriented to person, place, and time.  Skin: Skin is warm and dry.  Psychiatric: He has a normal mood and affect.  Vitals reviewed.  UC Treatments / Results  Labs (all labs ordered are listed, but only abnormal results are displayed) Labs Reviewed - No data to display  EKG  EKG Interpretation None       Radiology No results found.  Procedures Procedures (including critical care time)  Medications Ordered in UC Medications - No data to display   Initial Impression / Assessment and Plan / UC Course  I have reviewed the triage vital signs and the nursing notes.  Pertinent labs & imaging results that were available during my care of the patient were reviewed by me and considered in my medical decision making (see chart for details).  Clinical Course     Since patient's had this for 3 weeks and is failed one course of antibiotics will place on Ceftin 500 mg 1 tablet twice a day. For the wheezing albuterol inhaler only if he has bronchospasm occurring. We'll place on Tussionex for the cough is so bad at night and keeping him up at night and Allegra-D 1 tablet a day. Follow-up PCP in a week not better. Patient declined work note at this time.  Final Clinical Impressions(s) / UC Diagnoses   Final diagnoses:  Acute bronchitis with bronchospasm  Other acute sinusitis, recurrence not specified    New Prescriptions Discharge Medication List as of 06/01/2016  9:09 AM    START taking these medications   Details  albuterol (PROVENTIL HFA;VENTOLIN HFA) 108 (90 Base) MCG/ACT inhaler Inhale  2 puffs into the lungs every 4 (four) hours as needed for wheezing or shortness of breath., Starting Sat 06/01/2016, Normal    cefUROXime (CEFTIN) 500 MG tablet Take 1 tablet (500 mg total) by mouth 2 (two) times daily., Starting Sat 06/01/2016, Normal    chlorpheniramine-HYDROcodone (TUSSIONEX PENNKINETIC ER) 10-8 MG/5ML SUER Take 5 mLs by mouth every 12 (twelve) hours as needed for cough., Starting Sat 06/01/2016, Normal    fexofenadine-pseudoephedrine (ALLEGRA-D ALLERGY & CONGESTION) 180-240 MG 24 hr tablet Take 1 tablet by mouth daily., Starting Sat 06/01/2016, Normal         Note: This dictation was prepared with Dragon dictation along with smaller phrase technology. Any transcriptional errors that result from this process are unintentional.   Frederich Cha, MD 06/01/16 1131

## 2016-06-10 DIAGNOSIS — B078 Other viral warts: Secondary | ICD-10-CM | POA: Diagnosis not present

## 2016-12-18 NOTE — Progress Notes (Signed)
12/20/2016 9:02 AM   Brandon Mathews [birth year] [id number]  Referring provider: Ezequiel Kayser, MD [place] [name] [place], [place] [address]  Chief Complaint  Patient presents with  . Hematuria    1 year follow up  . Benign Prostatic Hypertrophy    HPI: 61 yo WM with a history of hematuria and BPH with LU TS who presents today for a one year follow up.    History of hematuria He is a former smoker, with a 30 ppd history.  Quit 5 years ago.  He is exposed to secondhand smoke.  Hematuria work up completed in 2017 with CTU and cystoscopy.  No malignancies seen.  Patient has not experienced any gross hematuria since his last visit.  His UA today was unremarkable.    BPH WITH LUTS  (prostate and/or bladder) His IPSS score today is 6, which is mild lower urinary tract symptomatology. He is mostly dissatisfied with his quality life due to his urinary symptoms.     His major complaint today is not feeling the same sexually since starting the tamsulosin and finasteride.  He could not elaborate further on what he was experiencing.    He denies any dysuria, hematuria or suprapubic pain.   He currently taking tamsulosin 0.4 mg and finasteride 5 mg daily.  He also denies any recent fevers, chills, nausea or vomiting. He does not have a family history of PCa.      IPSS    Row Name 12/20/16 0800         International Prostate Symptom Score   How often have you had the sensation of not emptying your bladder? Not at All     How often have you had to urinate less than every two hours? Less than 1 in 5 times     How often have you found you stopped and started again several times when you urinated? Less than 1 in 5 times     How often have you found it difficult to postpone urination? Not at All     How often have you had a weak urinary stream? About half the time     How often have you had to strain to start urination? Less than 1 in 5 times     How many times did  you typically get up at night to urinate? None     Total IPSS Score 6       Quality of Life due to urinary symptoms   If you were to spend the rest of your life with your urinary condition just the way it is now how would you feel about that? Mostly Disatisfied        Score:  1-7 Mild 8-19 Moderate 20-35 Severe  Patient states that over the last few months he has noted shortness of breath while climbing the stairs. He denied any chest pain or diaphoresis associated with this pain.    Also discovered incidental finding of a indurated right epididymis and a scarred right vas. He denied any trauma to the scrotal area or any history of infection or surgery. He states he does have right groin tenderness from time to time.  PMH: Past Medical History:  Diagnosis Date  . Arthritis   . BPH (benign prostatic hyperplasia)   . Heartburn   . Hematuria   . HTN (hypertension)   . Melanoma [facility])     Surgical History: Past Surgical History:  Procedure Laterality Date  .  MOHS SURGERY      Home Medications:  Allergies as of 12/20/2016   No Known Allergies     Medication List       Accurate as of 12/20/16  9:02 AM. Always use your most recent med list.          albuterol 108 (90 Base) MCG/ACT inhaler Commonly known as:  PROVENTIL HFA;VENTOLIN HFA Inhale 2 puffs into the lungs every 4 (four) hours as needed for wheezing or shortness of breath.   cefUROXime 500 MG tablet Commonly known as:  CEFTIN Take 1 tablet (500 mg total) by mouth 2 (two) times daily.   celecoxib 200 MG capsule Commonly known as:  CELEBREX Reported on 12/22/2015   chlorpheniramine-HYDROcodone 10-8 MG/5ML Suer Commonly known as:  TUSSIONEX PENNKINETIC ER Take 5 mLs by mouth every 12 (twelve) hours as needed for cough.   CIALIS 20 MG tablet Generic drug:  tadalafil Take by mouth. Reported on 12/22/2015   clotrimazole-betamethasone cream Commonly known as:  LOTRISONE   famotidine 10 MG tablet Commonly  known as:  PEPCID Take by mouth.   fexofenadine-pseudoephedrine 180-240 MG 24 hr tablet Commonly known as:  ALLEGRA-D ALLERGY & CONGESTION Take 1 tablet by mouth daily.   finasteride 5 MG tablet Commonly known as:  PROSCAR Take 1 tablet (5 mg total) by mouth daily.   lisinopril 10 MG tablet Commonly known as:  PRINIVIL,ZESTRIL Take by mouth.   MULTI-VITAMINS Tabs Take by mouth. Reported on 11/28/2015   MULTIVITAMIN PO Take by mouth.   tamsulosin 0.4 MG Caps capsule Commonly known as:  FLOMAX Take 1 capsule (0.4 mg total) by mouth daily.   triamcinolone ointment 0.1 % Commonly known as:  KENALOG       Allergies: No Known Allergies  Family History: Family History  Problem Relation Age of Onset  . Breast cancer Sister   . Kidney disease Neg Hx   . Prostate cancer Neg Hx   . Kidney cancer Neg Hx   . Bladder Cancer Neg Hx     Social History:  reports that he has quit smoking. He has never used smokeless tobacco. He reports that he drinks alcohol. He reports that he does not use drugs.  ROS: UROLOGY Frequent Urination?: No Hard to postpone urination?: No Burning/pain with urination?: No Get up at night to urinate?: No Leakage of urine?: No Urine stream starts and stops?: No Trouble starting stream?: No Do you have to strain to urinate?: No Blood in urine?: No Urinary tract infection?: No Sexually transmitted disease?: No Injury to kidneys or bladder?: No Painful intercourse?: No Weak stream?: No Erection problems?: No Penile pain?: No  Gastrointestinal Nausea?: No Vomiting?: No Indigestion/heartburn?: No Diarrhea?: No Constipation?: No  Constitutional Fever: No Night sweats?: No Weight loss?: No Fatigue?: No  Skin Skin rash/lesions?: No Itching?: No  Eyes Blurred vision?: No Double vision?: No  Ears/Nose/Throat Sore throat?: No Sinus problems?: No  Hematologic/Lymphatic Swollen glands?: No Easy bruising?: No  Cardiovascular Leg  swelling?: No Chest pain?: No  Respiratory Cough?: No Shortness of breath?: Yes  Endocrine Excessive thirst?: No  Musculoskeletal Back pain?: Yes Joint pain?: No  Neurological Headaches?: No Dizziness?: No  Psychologic Depression?: No Anxiety?: No  Physical Exam: BP 136/80   Pulse 67   Ht 5\' 8"  (1.727 m)   Wt 158 lb 4 oz (71.8 kg)   BMI 24.06 kg/m   Constitutional: Well nourished. Alert and oriented, No acute distress. HEENT: [place] AT, moist mucus membranes. Trachea midline,  no masses. Cardiovascular: No clubbing, cyanosis, or edema. Respiratory: Normal respiratory effort, no increased work of breathing. GI: Abdomen is soft, non tender, non distended, no abdominal masses. Liver and spleen not palpable.  No hernias appreciated.  Stool sample for occult testing is not indicated.   GU: No CVA tenderness.  No bladder fullness or masses.  Patient with circumcised phallus.   Urethral meatus is patent.  No penile discharge. No penile lesions or rashes. Scrotum without lesions, cysts, rashes and/or edema.  Testicles are located scrotally bilaterally. No masses are appreciated in the testicles. Left epididymis is normal.  Right epididymis is indurated.  Right vas is scarred.   Rectal: Patient with  normal sphincter tone. Anus and perineum without scarring or rashes. No rectal masses are appreciated. Prostate is approximately 60 grams, no nodules are appreciated. Seminal vesicles are normal. Skin: No rashes, bruises or suspicious lesions. Lymph: No cervical or inguinal adenopathy. Neurologic: Grossly intact, no focal deficits, moving all 4 extremities. Psychiatric: Normal mood and affect.  Laboratory Data: PSA History  2.6  In 11/2015  Urinalysis Unremarkable.  See EPIC.    I have reviewed the labs   Assessment & Plan:    1.History of hematuria  - hematuria work up completed in 2017 - no malignancies found  - Urinalysis, Complete - unremarkable  - patient will report any  gross hematuria  - RTC in one year for UA  2. BPH with LUTS  - IPSS score is 6/4  - Continue conservative management, avoiding bladder irritants and timed voiding's  - most bothersome symptoms is/are sexual dysfunction  - cystoscopy in 2017 demonstrated  Enlarged prostate  -visually obstructive and Retroflexion shows intravesical lobe  - Continue tamsulosin 0.4 mg daily and finasteride 5 mg daily: refills given  - RTC in 12 months for IPSS, PSA, PVR and exam   3. Right epididymal mass  - obtain scrotal ultrasound for further evaluation  - RTC for report  4.SOB on exertion  - explained to the patient that will this symptom, his ED and his family history of heart disease this could be an early symptom of heart disease for him   - advised the patient to contact his PCP, Dr. Raechel Ache to see if he needs to be seen prior to his yearly physical in August  - he is going over to Dr. Raechel Ache' office after this visit   Return for scrotal ultrasound report.  These notes generated with voice recognition software. I apologize for typographical errors.  Zara Council, [facility] [address] [place], [place] [address] [phone]

## 2016-12-19 ENCOUNTER — Other Ambulatory Visit: Payer: Self-pay | Admitting: *Deleted

## 2016-12-19 DIAGNOSIS — N400 Enlarged prostate without lower urinary tract symptoms: Secondary | ICD-10-CM

## 2016-12-19 DIAGNOSIS — R310 Gross hematuria: Secondary | ICD-10-CM

## 2016-12-20 ENCOUNTER — Ambulatory Visit (INDEPENDENT_AMBULATORY_CARE_PROVIDER_SITE_OTHER): Payer: 59 | Admitting: Urology

## 2016-12-20 ENCOUNTER — Other Ambulatory Visit
Admission: RE | Admit: 2016-12-20 | Discharge: 2016-12-20 | Disposition: A | Discharge status: Home / Self Care | Payer: 59 | Source: Ambulatory Visit | Attending: Urology | Admitting: Urology

## 2016-12-20 ENCOUNTER — Ambulatory Visit: Payer: 59

## 2016-12-20 ENCOUNTER — Encounter: Payer: Self-pay | Admitting: Urology

## 2016-12-20 VITALS — BP 136/80 | HR 67 | Ht 68.0 in | Wt 158.2 lb

## 2016-12-20 DIAGNOSIS — N138 Other obstructive and reflux uropathy: Secondary | ICD-10-CM | POA: Diagnosis not present

## 2016-12-20 DIAGNOSIS — Z87448 Personal history of other diseases of urinary system: Secondary | ICD-10-CM | POA: Diagnosis not present

## 2016-12-20 DIAGNOSIS — N401 Enlarged prostate with lower urinary tract symptoms: Secondary | ICD-10-CM

## 2016-12-20 DIAGNOSIS — Z79899 Other long term (current) drug therapy: Secondary | ICD-10-CM | POA: Diagnosis not present

## 2016-12-20 DIAGNOSIS — N509 Disorder of male genital organs, unspecified: Secondary | ICD-10-CM | POA: Diagnosis not present

## 2016-12-20 DIAGNOSIS — I10 Essential (primary) hypertension: Secondary | ICD-10-CM | POA: Diagnosis not present

## 2016-12-20 DIAGNOSIS — R0609 Other forms of dyspnea: Secondary | ICD-10-CM | POA: Diagnosis not present

## 2016-12-20 DIAGNOSIS — R0602 Shortness of breath: Secondary | ICD-10-CM | POA: Diagnosis not present

## 2016-12-20 DIAGNOSIS — R310 Gross hematuria: Secondary | ICD-10-CM | POA: Insufficient documentation

## 2016-12-20 DIAGNOSIS — N5089 Other specified disorders of the male genital organs: Secondary | ICD-10-CM

## 2016-12-20 DIAGNOSIS — N400 Enlarged prostate without lower urinary tract symptoms: Secondary | ICD-10-CM | POA: Diagnosis not present

## 2016-12-20 DIAGNOSIS — R0600 Dyspnea, unspecified: Secondary | ICD-10-CM | POA: Diagnosis not present

## 2016-12-20 LAB — URINALYSIS, COMPLETE (UACMP) WITH MICROSCOPIC
BILIRUBIN URINE: NEGATIVE
Bacteria, UA: NONE SEEN
Glucose, UA: NEGATIVE mg/dL
HGB URINE DIPSTICK: NEGATIVE
Ketones, ur: NEGATIVE mg/dL
LEUKOCYTES UA: NEGATIVE
NITRITE: NEGATIVE
PROTEIN: NEGATIVE mg/dL
Specific Gravity, Urine: >1.03 — ABNORMAL HIGH (ref 1.005–1.030)
Squamous Epithelial / LPF: NONE SEEN
pH: 5 (ref 5.0–8.0)

## 2016-12-20 LAB — PSA: Prostatic Specific Antigen: 1.43 ng/mL (ref 0.00–4.00)

## 2016-12-23 ENCOUNTER — Ambulatory Visit
Admission: RE | Admit: 2016-12-23 | Discharge: 2016-12-23 | Disposition: A | Discharge status: Home / Self Care | Payer: 59 | Source: Ambulatory Visit | Attending: Urology | Admitting: Urology

## 2016-12-23 ENCOUNTER — Telehealth: Payer: Self-pay

## 2016-12-23 DIAGNOSIS — L720 Epidermal cyst: Secondary | ICD-10-CM | POA: Diagnosis not present

## 2016-12-23 DIAGNOSIS — N5089 Other specified disorders of the male genital organs: Secondary | ICD-10-CM

## 2016-12-23 DIAGNOSIS — N509 Disorder of male genital organs, unspecified: Secondary | ICD-10-CM | POA: Insufficient documentation

## 2016-12-23 DIAGNOSIS — N503 Cyst of epididymis: Secondary | ICD-10-CM | POA: Diagnosis not present

## 2016-12-23 NOTE — Telephone Encounter (Signed)
Called patient. No answer. Left vmail per DPR.  

## 2016-12-23 NOTE — Telephone Encounter (Signed)
-----   Message from Nori Riis, PA-C sent at 12/20/2016  4:48 PM EDT ----- Please let Mr. Everitt know that his PSA is normal.

## 2016-12-31 ENCOUNTER — Other Ambulatory Visit: Payer: Self-pay | Admitting: Urology

## 2016-12-31 DIAGNOSIS — N401 Enlarged prostate with lower urinary tract symptoms: Secondary | ICD-10-CM

## 2017-01-02 DIAGNOSIS — I10 Essential (primary) hypertension: Secondary | ICD-10-CM | POA: Diagnosis not present

## 2017-01-02 DIAGNOSIS — Z79899 Other long term (current) drug therapy: Secondary | ICD-10-CM | POA: Diagnosis not present

## 2017-01-02 DIAGNOSIS — N2889 Other specified disorders of kidney and ureter: Secondary | ICD-10-CM | POA: Insufficient documentation

## 2017-01-02 DIAGNOSIS — Z0000 Encounter for general adult medical examination without abnormal findings: Secondary | ICD-10-CM | POA: Diagnosis not present

## 2017-01-02 NOTE — Progress Notes (Signed)
01/03/2017 2:31 PM   Brandon Mathews [birth year] [id number]  Referring provider: Ezequiel Kayser, MD [place] [facility] [place], [place] [address]  Chief Complaint  Patient presents with  . Results    Scrotal US Report    HPI: 61 yo WM with a history of hematuria, BPH and LU TS who had an incidental finding of a hardened right vas who underwent a scrotal ultrasound for further evaluation.  At his visit two weeks ago, patient stated that over the last few months he has noted shortness of breath while climbing the stairs. He denied any chest pain or diaphoresis associated with this pain.  We also discovered incidental finding of a indurated right epididymis and a scarred right vas. He denied any trauma to the scrotal area or any history of infection or surgery. He states he does have right groin tenderness from time to time.  Scrotal ultrasound performed on 12/23/2016 noted an unremarkable appearance of the testicles. No evidence of torsion.  Minimally complex cystic lesion within the left epididymis with a single thin internal septation and measures 1.6 x 0.9 x 1.0 cm. This likely represents either a spermatocele or minimally complex epididymal cyst.  Small simple right epididymal cyst.  Result shared with the patient.    He did see Dr. Raechel Ache for the SOB and during that visit some concerns were raised about the cysts seen in the left kidney.  It was suggested that patient have a repeated CT scan, but patient is having concerns about the financial aspect of the issue.    PMH: Past Medical History:  Diagnosis Date  . Arthritis   . BPH (benign prostatic hyperplasia)   . Heartburn   . Hematuria   . HTN (hypertension)   . Melanoma [facility])     Surgical History: Past Surgical History:  Procedure Laterality Date  . MOHS SURGERY      Home Medications:  Allergies as of 01/03/2017   No Known Allergies     Medication List       Accurate as of 01/03/17 11:59 PM.  Always use your most recent med list.          albuterol 108 (90 Base) MCG/ACT inhaler Commonly known as:  PROVENTIL HFA;VENTOLIN HFA Inhale 2 puffs into the lungs every 4 (four) hours as needed for wheezing or shortness of breath.   cefUROXime 500 MG tablet Commonly known as:  CEFTIN Take 1 tablet (500 mg total) by mouth 2 (two) times daily.   celecoxib 200 MG capsule Commonly known as:  CELEBREX Reported on 12/22/2015   chlorpheniramine-HYDROcodone 10-8 MG/5ML Suer Commonly known as:  TUSSIONEX PENNKINETIC ER Take 5 mLs by mouth every 12 (twelve) hours as needed for cough.   CIALIS 20 MG tablet Generic drug:  tadalafil Take by mouth. Reported on 12/22/2015   clotrimazole-betamethasone cream Commonly known as:  LOTRISONE   famotidine 10 MG tablet Commonly known as:  PEPCID Take by mouth.   fexofenadine-pseudoephedrine 180-240 MG 24 hr tablet Commonly known as:  ALLEGRA-D ALLERGY & CONGESTION Take 1 tablet by mouth daily.   finasteride 5 MG tablet Commonly known as:  PROSCAR Take 1 tablet (5 mg total) by mouth daily.   lisinopril 10 MG tablet Commonly known as:  PRINIVIL,ZESTRIL Take by mouth.   MULTI-VITAMINS Tabs Take by mouth. Reported on 11/28/2015   MULTIVITAMIN PO Take by mouth.   tamsulosin 0.4 MG Caps capsule Commonly known as:  FLOMAX Take 1 capsule (0.4 mg  total) by mouth daily.   triamcinolone ointment 0.1 % Commonly known as:  KENALOG       Allergies: No Known Allergies  Family History: Family History  Problem Relation Age of Onset  . Breast cancer Sister   . Kidney disease Neg Hx   . Prostate cancer Neg Hx   . Kidney cancer Neg Hx   . Bladder Cancer Neg Hx     Social History:  reports that he has quit smoking. He has never used smokeless tobacco. He reports that he drinks alcohol. He reports that he does not use drugs.  ROS: UROLOGY Frequent Urination?: No Hard to postpone urination?: No Burning/pain with urination?: No Get up  at night to urinate?: No Leakage of urine?: No Urine stream starts and stops?: No Trouble starting stream?: No Do you have to strain to urinate?: No Blood in urine?: No Urinary tract infection?: No Sexually transmitted disease?: No Injury to kidneys or bladder?: No Painful intercourse?: No Weak stream?: No Erection problems?: No Penile pain?: No  Gastrointestinal Nausea?: No Vomiting?: No Indigestion/heartburn?: No Diarrhea?: No Constipation?: No  Constitutional Fever: No Night sweats?: No Weight loss?: No Fatigue?: No  Skin Skin rash/lesions?: No Itching?: No  Eyes Blurred vision?: No Double vision?: No  Ears/Nose/Throat Sore throat?: No Sinus problems?: No  Hematologic/Lymphatic Swollen glands?: No Easy bruising?: No  Cardiovascular Leg swelling?: No Chest pain?: No  Respiratory Cough?: No Shortness of breath?: No  Endocrine Excessive thirst?: No  Musculoskeletal Back pain?: No Joint pain?: No  Neurological Headaches?: No Dizziness?: No  Psychologic Depression?: No Anxiety?: No  Physical Exam: BP (!) 146/87   Pulse 72   Ht 5\' 8"  (1.727 m)   Wt 158 lb (71.7 kg)   BMI 24.02 kg/m   Constitutional: Well nourished. Alert and oriented, No acute distress. HEENT: [place] AT, moist mucus membranes. Trachea midline, no masses. Cardiovascular: No clubbing, cyanosis, or edema. Respiratory: Normal respiratory effort, no increased work of breathing. Skin: No rashes, bruises or suspicious lesions. Lymph: No cervical or inguinal adenopathy. Neurologic: Grossly intact, no focal deficits, moving all 4 extremities. Psychiatric: Normal mood and affect.  Laboratory Data: PSA History  2.6  In 11/2015  I have reviewed the labs  Pertinent Imaging CLINICAL DATA:  61 year old male with epididymal mass and pain on the right side for the past week  EXAM: SCROTAL ULTRASOUND  DOPPLER ULTRASOUND OF THE TESTICLES  TECHNIQUE: Complete ultrasound  examination of the testicles, epididymis, and other scrotal structures was performed. Color and spectral Doppler ultrasound were also utilized to evaluate blood flow to the testicles.  COMPARISON:  Prior CT scan of the abdomen and pelvis 12/08/2015  FINDINGS: Right testicle  Measurements: 4.0 x 2.2 x 3.1 cm. No mass or microlithiasis visualized.  Left testicle  Measurements: 4.5 x 2.9 x 2.8 cm. No mass or microlithiasis visualized.  Right epididymis: Normal in size and appearance. Small simple cyst in the epididymal head measuring 3 x 3 x 4 mm.  Left epididymis: Normal in size and appearance. Minimally complex anechoic cystic structure with a single thin internal septation in the epididymis measuring 1.6 x 0.9 x 1.0 cm. No internal vascularity.  Hydrocele:  None visualized.  Varicocele:  None visualized.  Pulsed Doppler interrogation of both testes demonstrates normal low resistance arterial and venous waveforms bilaterally.  IMPRESSION: 1. Unremarkable appearance of the testicles. No evidence of torsion. 2. Minimally complex cystic lesion within the left epididymis with a single thin internal septation and measures 1.6 x  0.9 x 1.0 cm. This likely represents either a spermatocele or minimally complex epididymal cyst. 3. Small simple right epididymal cyst.   Electronically Signed   By: Jacqulynn Cadet M.D.   On: 12/23/2016 18:33    Assessment & Plan:    1. Right epididymal mass  - no worrisome findings on ultrasound  2. Bilateral epididymal cysts  - explained findings to the patient  3. History of hematuria  - RTC in one year for UA  4. BPH with LUTS  - RTC in 12 months for IPSS, PSA, PVR and exam   5.SOB on exertion  - seen Dr. Raechel Ache  7. Left renal cysts  - Dr. Pilar Jarvis does not feel an additional CT scan is warranted at this time, but I have offered a RUS if patient desired further evaluation - he will follow up in one year  Return in  about 1 year (around 01/03/2018) for UA, I PSS, PSA and exam.  These notes generated with voice recognition software. I apologize for typographical errors.  Zara Council, [facility] [address], [place], [place] [address] [phone]

## 2017-01-03 ENCOUNTER — Encounter: Payer: Self-pay | Admitting: Urology

## 2017-01-03 ENCOUNTER — Telehealth: Payer: Self-pay | Admitting: Urology

## 2017-01-03 ENCOUNTER — Ambulatory Visit (INDEPENDENT_AMBULATORY_CARE_PROVIDER_SITE_OTHER): Payer: 59 | Admitting: Urology

## 2017-01-03 VITALS — BP 146/87 | HR 72 | Ht 68.0 in | Wt 158.0 lb

## 2017-01-03 DIAGNOSIS — N281 Cyst of kidney, acquired: Secondary | ICD-10-CM

## 2017-01-03 DIAGNOSIS — N401 Enlarged prostate with lower urinary tract symptoms: Secondary | ICD-10-CM

## 2017-01-03 DIAGNOSIS — N138 Other obstructive and reflux uropathy: Secondary | ICD-10-CM | POA: Diagnosis not present

## 2017-01-03 DIAGNOSIS — R0602 Shortness of breath: Secondary | ICD-10-CM

## 2017-01-03 DIAGNOSIS — N509 Disorder of male genital organs, unspecified: Secondary | ICD-10-CM

## 2017-01-03 DIAGNOSIS — N5089 Other specified disorders of the male genital organs: Secondary | ICD-10-CM

## 2017-01-03 DIAGNOSIS — Z87448 Personal history of other diseases of urinary system: Secondary | ICD-10-CM | POA: Diagnosis not present

## 2017-01-03 DIAGNOSIS — N503 Cyst of epididymis: Secondary | ICD-10-CM | POA: Diagnosis not present

## 2017-01-03 MED ORDER — FINASTERIDE 5 MG PO TABS: 5.0000 mg | ORAL_TABLET | Freq: Every day | ORAL | 3 refills | Status: DC

## 2017-01-03 MED ORDER — TAMSULOSIN HCL 0.4 MG PO CAPS: 0.4000 mg | ORAL_CAPSULE | Freq: Every day | ORAL | 9 refills | Status: DC

## 2017-01-03 NOTE — Telephone Encounter (Signed)
Please let Mr. Brandon Mathews know that Dr. Pilar Jarvis does not feel a CT is necessary at this time, but if he is concerned with can get a RUS.  We do need to keep monitoring his urine for blood on a yearly basis.

## 2017-01-03 NOTE — Telephone Encounter (Signed)
LMOM

## 2017-01-06 NOTE — Telephone Encounter (Signed)
LMOM

## 2017-01-06 NOTE — Telephone Encounter (Signed)
Spoke with pt in reference to CT and RUS. Pt became very upset stating Dr. Pilar Jarvis needs to communicate with Dr. Dorthula Perfect. Pt wants to know what really needs to be done at this point in reference to area on kidney previously found. Please advise.

## 2017-01-07 NOTE — Telephone Encounter (Signed)
Spoke with pt in reference to CT and RUS. Reinforced with pt Dr. Pilar Jarvis is not concerned about the spots as stated at his appt last year and by [facility]. Reinforced with pt will need to f/u yearly for blood in the urine. Also made pt aware will send Dr. Pilar Jarvis note from last year to Dr. Dorthula Perfect. Pt voiced understanding of whole conversation.

## 2017-02-19 DIAGNOSIS — Z86018 Personal history of other benign neoplasm: Secondary | ICD-10-CM | POA: Diagnosis not present

## 2017-02-19 DIAGNOSIS — Z85820 Personal history of malignant melanoma of skin: Secondary | ICD-10-CM | POA: Diagnosis not present

## 2017-02-19 DIAGNOSIS — L720 Epidermal cyst: Secondary | ICD-10-CM | POA: Diagnosis not present

## 2017-03-14 DIAGNOSIS — Z23 Encounter for immunization: Secondary | ICD-10-CM | POA: Diagnosis not present

## 2017-04-21 DIAGNOSIS — G8929 Other chronic pain: Secondary | ICD-10-CM | POA: Diagnosis not present

## 2017-04-21 DIAGNOSIS — M545 Low back pain: Secondary | ICD-10-CM | POA: Diagnosis not present

## 2017-04-21 DIAGNOSIS — I10 Essential (primary) hypertension: Secondary | ICD-10-CM | POA: Diagnosis not present

## 2017-04-21 DIAGNOSIS — N451 Epididymitis: Secondary | ICD-10-CM | POA: Diagnosis not present

## 2017-05-09 DIAGNOSIS — D3132 Benign neoplasm of left choroid: Secondary | ICD-10-CM | POA: Diagnosis not present

## 2017-09-01 DIAGNOSIS — L720 Epidermal cyst: Secondary | ICD-10-CM | POA: Diagnosis not present

## 2017-09-01 DIAGNOSIS — Z85820 Personal history of malignant melanoma of skin: Secondary | ICD-10-CM | POA: Diagnosis not present

## 2017-09-01 DIAGNOSIS — L578 Other skin changes due to chronic exposure to nonionizing radiation: Secondary | ICD-10-CM | POA: Diagnosis not present

## 2018-01-09 IMAGING — US US ART/VEN ABD/PELV/SCROTUM DOPPLER LTD
1 series · 13 of 25 positions shown · non-contrast
Comparison: Prior CT scan of the abdomen and pelvis 12/08/2015

CLINICAL DATA: 61-year-old male with epididymal mass and pain on
the right side for the past week

EXAM:
SCROTAL ULTRASOUND
DOPPLER ULTRASOUND OF THE TESTICLES
TECHNIQUE: Complete ultrasound examination of the testicles, epididymis, and
other scrotal structures was performed. Color and spectral Doppler
ultrasound were also utilized to evaluate blood flow to the
testicles.

[Series 1: us art/ven abd/pelv/scrotum doppler ltd · 0.07mm/px · 13 of 63 slices shown]
[im 1/63]
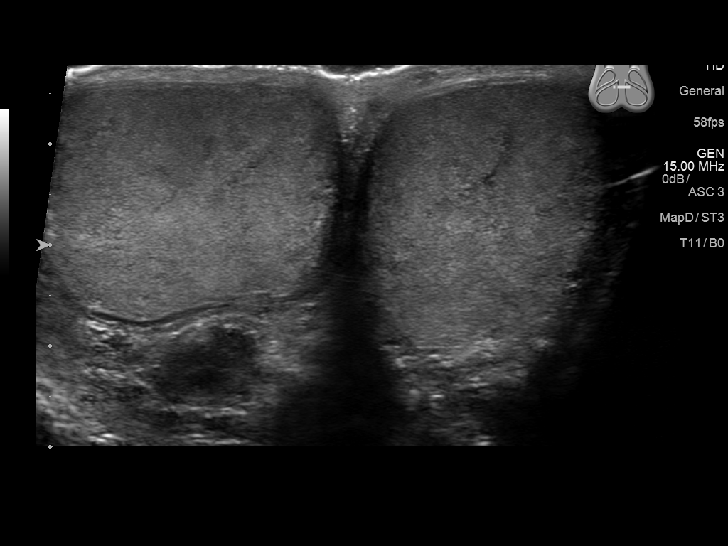
[im 6/63]
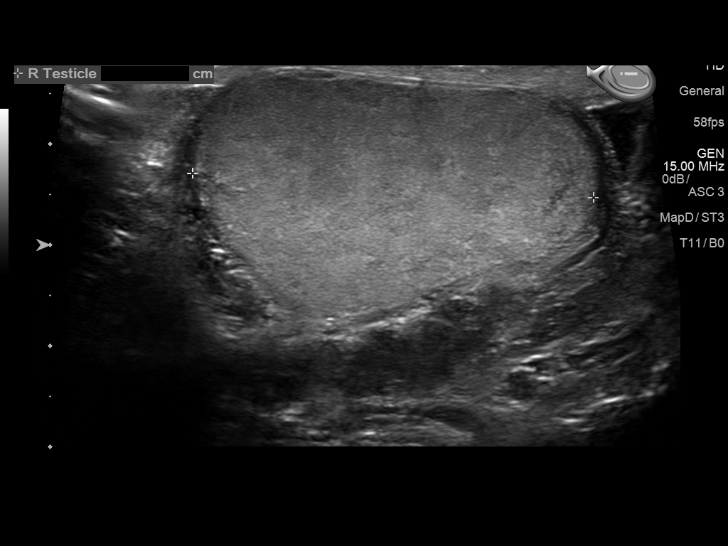
[im 11/63]
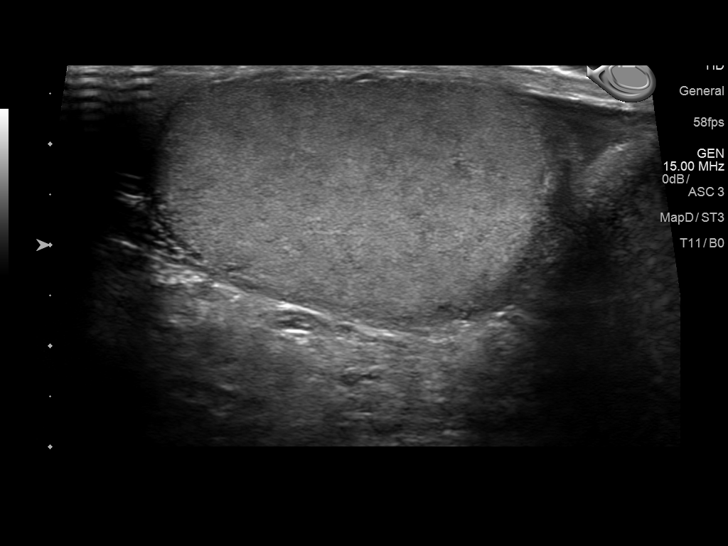
[im 16/63]
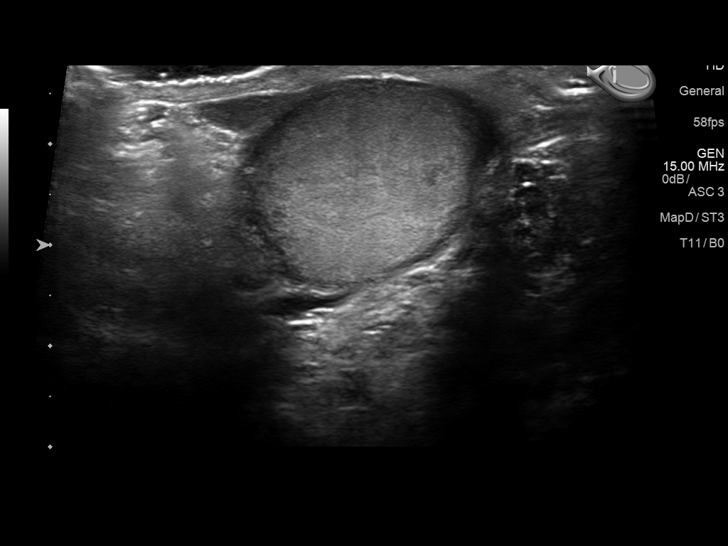
[im 21/63]
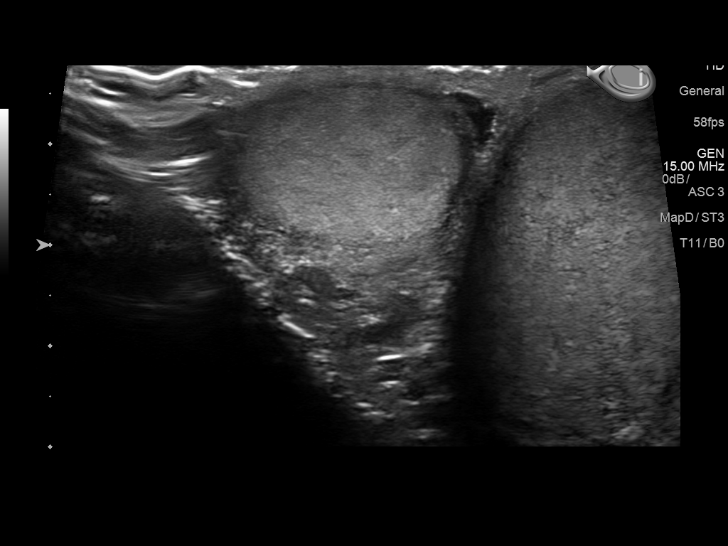
[im 26/63]
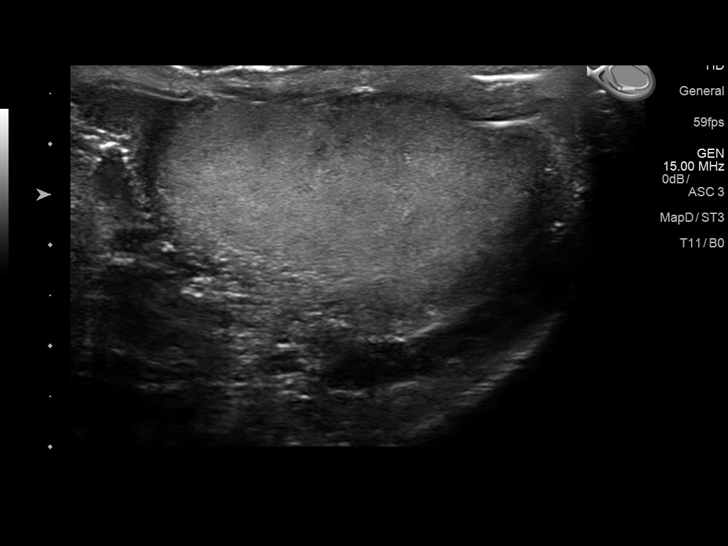
[im 32/63]
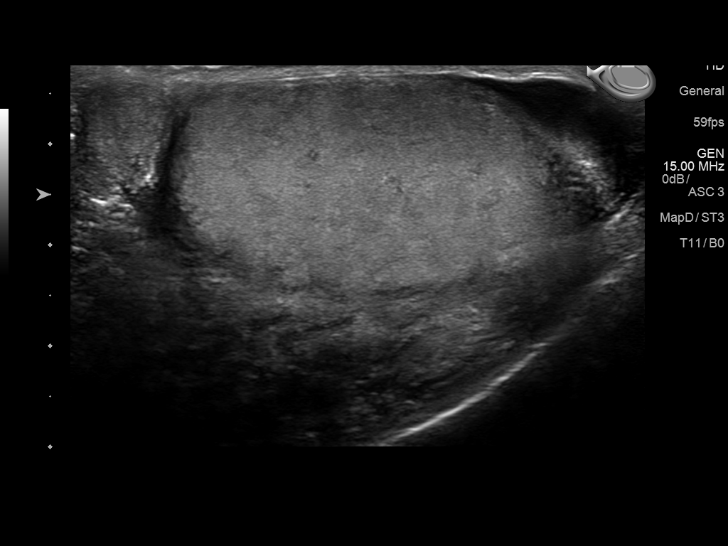
[im 37/63]
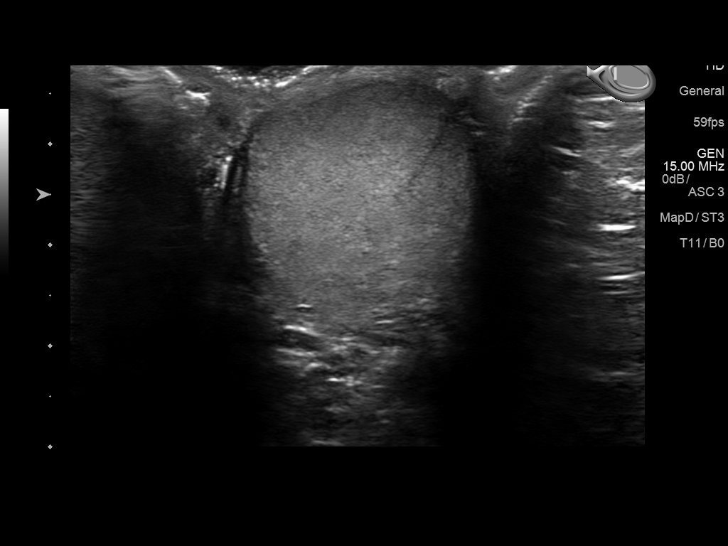
[im 42/63]
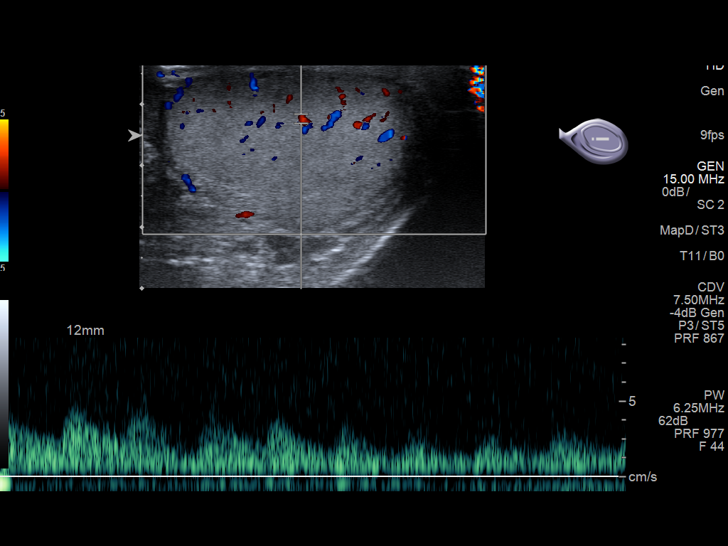
[im 47/63]
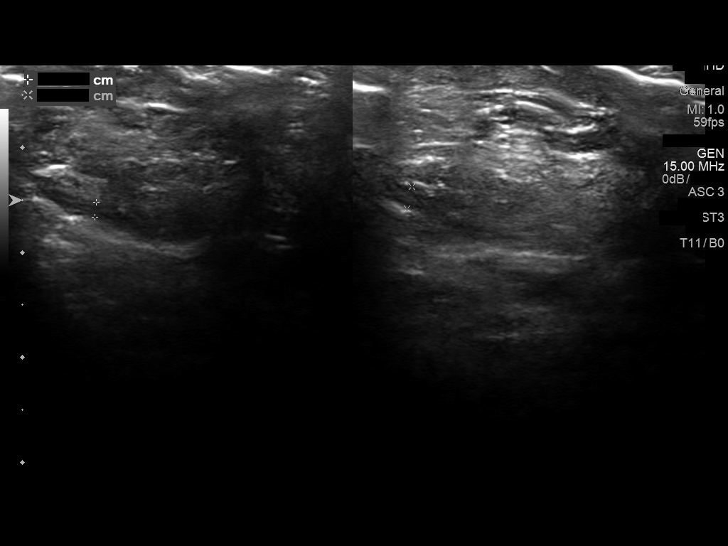
[im 52/63]
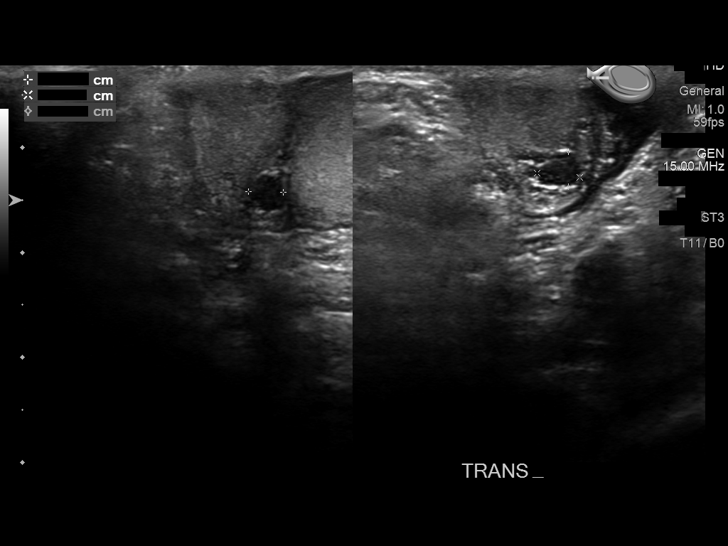
[im 57/63]
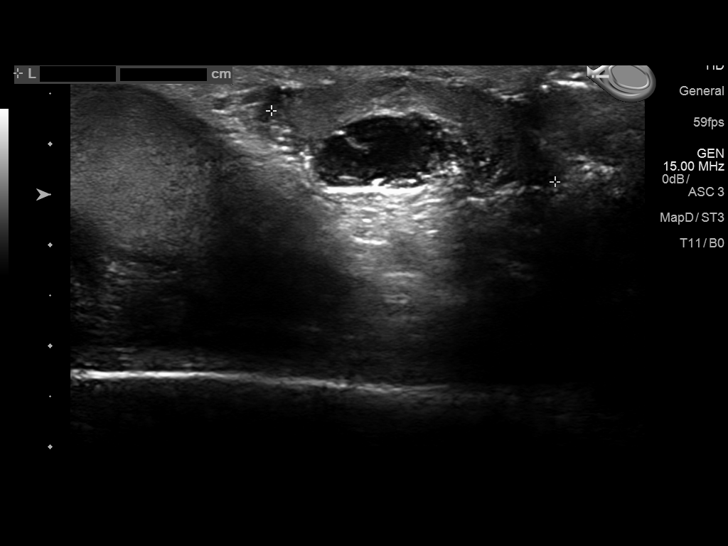
[im 63/63]
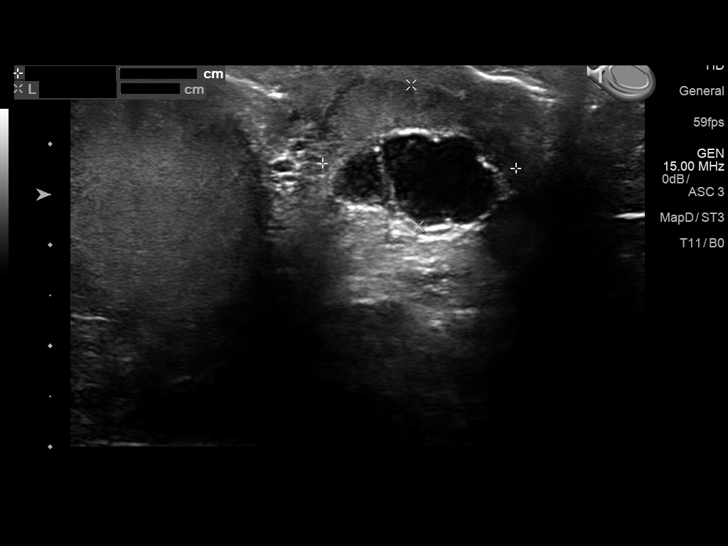

[13 of 25 positions shown; findings below may reference images not displayed]

FINDINGS: Right testicle

Measurements: 4.0 x 2.2 x 3.1 cm. No mass or microlithiasis
visualized.

Left testicle

Measurements: 4.5 x 2.9 x 2.8 cm. No mass or microlithiasis
visualized.

Right epididymis: Normal in size and appearance. Small simple cyst
in the epididymal head measuring 3 x 3 x 4 mm.

Left epididymis: Normal in size and appearance. Minimally complex
anechoic cystic structure with a single thin internal septation in
the epididymis measuring 1.6 x 0.9 x 1.0 cm. No internal
vascularity.

Hydrocele:  None visualized.

Varicocele:  None visualized.

Pulsed Doppler interrogation of both testes demonstrates normal low
resistance arterial and venous waveforms bilaterally.
IMPRESSION: 1. Unremarkable appearance of the testicles. No evidence of torsion.
2. Minimally complex cystic lesion within the left epididymis with a
single thin internal septation and measures 1.6 x 0.9 x 1.0 cm. This
likely represents either a spermatocele or minimally complex
epididymal cyst.
3. Small simple right epididymal cyst.

## 2018-01-19 DIAGNOSIS — Z79899 Other long term (current) drug therapy: Secondary | ICD-10-CM | POA: Diagnosis not present

## 2018-01-19 DIAGNOSIS — Z0000 Encounter for general adult medical examination without abnormal findings: Secondary | ICD-10-CM | POA: Diagnosis not present

## 2018-01-19 DIAGNOSIS — N401 Enlarged prostate with lower urinary tract symptoms: Secondary | ICD-10-CM | POA: Diagnosis not present

## 2018-02-09 DIAGNOSIS — Z1211 Encounter for screening for malignant neoplasm of colon: Secondary | ICD-10-CM | POA: Diagnosis not present

## 2018-02-09 DIAGNOSIS — Z01818 Encounter for other preprocedural examination: Secondary | ICD-10-CM | POA: Diagnosis not present

## 2018-03-10 DIAGNOSIS — Z23 Encounter for immunization: Secondary | ICD-10-CM | POA: Diagnosis not present

## 2018-03-13 ENCOUNTER — Ambulatory Visit (INDEPENDENT_AMBULATORY_CARE_PROVIDER_SITE_OTHER): Payer: 59 | Admitting: Urology

## 2018-03-13 ENCOUNTER — Other Ambulatory Visit: Payer: Self-pay

## 2018-03-13 ENCOUNTER — Encounter: Payer: Self-pay | Admitting: Urology

## 2018-03-13 VITALS — BP 153/88 | HR 78 | Ht 68.0 in | Wt 160.0 lb

## 2018-03-13 DIAGNOSIS — N401 Enlarged prostate with lower urinary tract symptoms: Secondary | ICD-10-CM

## 2018-03-13 DIAGNOSIS — N138 Other obstructive and reflux uropathy: Secondary | ICD-10-CM

## 2018-03-13 DIAGNOSIS — N491 Inflammatory disorders of spermatic cord, tunica vaginalis and vas deferens: Secondary | ICD-10-CM | POA: Diagnosis not present

## 2018-03-13 LAB — BLADDER SCAN AMB NON-IMAGING: SCAN RESULT: 25

## 2018-03-13 NOTE — Patient Instructions (Addendum)
Take motrin 800 mg twice daily x 7 days  Cut back to 400 mg twice daily as tolerated (stop for GI upset)  Scrotal support  Stop floxmax/ finasteride  Return in 1 months to recheck symptoms and possible cord block  Vasitis

## 2018-03-13 NOTE — Progress Notes (Signed)
03/13/2018 2:22 PM   Brandon Mathews 10/07/1955 [id number]  Referring provider: Ezequiel Kayser, MD [facility] [place], [place] [address]  Chief Complaint  Patient presents with  . Benign Prostatic Hypertrophy    HPI: 62 year old male who returns today with primarily complaints of right scrotal pain discomfort and swelling.  He has previously seen and evaluated for similar issues, most recently by [facility] on 01/2017.  A scrotal ultrasound around the time which showed spermatocele measuring 1.6 x 1.0 cm on the left but no pathology on the right.  At the time, pain and discomfort.  Moderate recently, he was seen and evaluated by his primary care physician, Dr. please treated for presumed epididymitis.  Cipro which did not help her symptoms.  Supportive care was recommended.  He continues to have discomfort in his right hemiscrotum exacerbated by activity including ejaculation specifically.  No alleviating factors.  These have recurrent flares with this over the past year or so.  He notes that the pain radiates to his right thigh and back.  He does also have a history of prostamegaly BPH.  He is on Flomax and finasteride.  He reports he was started finasteride due to the size of his gland on CT scan several years ago.  He underwent hematuria work-up in 2017 including CT urogram and cystoscopy.  Most recent PSA 1.03 on 01/2018.   IPSS    Row Name 03/13/18 1400         International Prostate Symptom Score   How often have you had the sensation of not emptying your bladder?  Not at All     How often have you had to urinate less than every two hours?  Less than 1 in 5 times     How often have you found you stopped and started again several times when you urinated?  Less than 1 in 5 times     How often have you found it difficult to postpone urination?  Not at All     How often have you had a weak urinary stream?  Less than 1 in 5 times     How  often have you had to strain to start urination?  Not at All     How many times did you typically get up at night to urinate?  1 Time     Total IPSS Score  4       Quality of Life due to urinary symptoms   If you were to spend the rest of your life with your urinary condition just the way it is now how would you feel about that?  Mixed        Score:  1-7 Mild 8-19 Moderate 20-35 Severe    PMH: Past Medical History:  Diagnosis Date  . Arthritis   . BPH (benign prostatic hyperplasia)   . Heartburn   . Hematuria   . HTN (hypertension)   . Melanoma [facility])     Surgical History: Past Surgical History:  Procedure Laterality Date  . MOHS SURGERY      Home Medications:  Allergies as of 03/13/2018   No Known Allergies     Medication List        Accurate as of 03/13/18 11:59 PM. Always use your most recent med list.          CIALIS 20 MG tablet Generic drug:  tadalafil Take by mouth. Reported on 12/22/2015   clotrimazole-betamethasone cream Commonly known as:  LOTRISONE   finasteride 5 MG tablet Commonly known as:  PROSCAR Take 1 tablet (5 mg total) by mouth daily.   lisinopril 10 MG tablet Commonly known as:  PRINIVIL,ZESTRIL Take by mouth.   MULTI-VITAMINS Tabs Take by mouth. Reported on 11/28/2015   MULTIVITAMIN PO Take by mouth.   tamsulosin 0.4 MG Caps capsule Commonly known as:  FLOMAX Take 1 capsule (0.4 mg total) by mouth daily.   triamcinolone ointment 0.1 % Commonly known as:  KENALOG       Allergies: No Known Allergies  Family History: Family History  Problem Relation Age of Onset  . Breast cancer Sister   . Kidney disease Neg Hx   . Prostate cancer Neg Hx   . Kidney cancer Neg Hx   . Bladder Cancer Neg Hx     Social History:  reports that he has quit smoking. He has never used smokeless tobacco. He reports that he drinks alcohol. He reports that he does not use drugs.  ROS: UROLOGY Frequent Urination?: No Hard to postpone  urination?: No Burning/pain with urination?: No Get up at night to urinate?: No Leakage of urine?: No Urine stream starts and stops?: No Trouble starting stream?: No Do you have to strain to urinate?: No Blood in urine?: No Urinary tract infection?: Yes Sexually transmitted disease?: No Injury to kidneys or bladder?: No Painful intercourse?: Yes Weak stream?: No Erection problems?: No Penile pain?: No  Gastrointestinal Nausea?: No Vomiting?: No Indigestion/heartburn?: No Diarrhea?: No Constipation?: No  Constitutional Fever: No Night sweats?: No Weight loss?: No Fatigue?: No  Skin Skin rash/lesions?: No Itching?: No  Eyes Blurred vision?: No Double vision?: No  Ears/Nose/Throat Sore throat?: No Sinus problems?: No  Hematologic/Lymphatic Swollen glands?: No Easy bruising?: No  Cardiovascular Leg swelling?: No Chest pain?: No  Respiratory Cough?: No Shortness of breath?: No  Endocrine Excessive thirst?: No  Musculoskeletal Back pain?: Yes Joint pain?: No  Neurological Headaches?: No Dizziness?: No  Psychologic Depression?: No Anxiety?: No  Physical Exam: BP (!) 153/88   Pulse 78   Ht 5\' 8"  (1.727 m)   Wt 160 lb (72.6 kg)   BMI 24.33 kg/m   Constitutional:  Alert and oriented, No acute distress. HEENT:  AT, moist mucus membranes.  Trachea midline, no masses. Cardiovascular: No clubbing, cyanosis, or edema. Respiratory: Normal respiratory effort, no increased work of breathing. GI: Abdomen is soft, nontender, nondistended, no abdominal masses GU: Bilateral testicles normal descended, no masses.  He does have tenderness along an abnormally dilated and rugated right as extending the entire length of the palpable vas.  No epididymal tenderness.  No overlying scrotal skin changes.  No palpable inguinal hernias bilaterally.  Normal phallus with orthotopic meatus. Skin: No rashes, bruises or suspicious lesions. Neurologic: Grossly intact, no  focal deficits, moving all 4 extremities. Psychiatric: Normal mood and affect.  Laboratory Data:  Lab Results  Component Value Date   CREATININE 1.15 11/28/2015    Urinalysis NA  Pertinent Imaging: Results for orders placed or performed in visit on 03/13/18  BLADDER SCAN AMB NON-IMAGING  Result Value Ref Range   Scan Result 25     Assessment & Plan:    1. BPH with obstruction/lower urinary tract symptoms Discussed at length today Prostamegaly without significant urinary symptoms He is unsure whether or not the medications are helping him We discussed that at this point, would be reasonable to come off the medications and keep close track of his urinary symptoms to see if he  really needs to be on dual pharmacotherapy Alternatives to pharmacotherapy were also discussed if he has refractory symptoms including the option for surgery Adequate bladder emptying today - BLADDER SCAN AMB NON-IMAGING  2. Vasitis Abnormal GU exam today, dilated/inflamed right vas findings are most consistent with vasitis, which generally is seen following vasectomy which he has not had At this point, I recommended further evaluation including TB test which can be done at the next appointment (no risk factors) as well as CT of the pelvis to rule out any intrapelvic masses, lesions, or any contributing factors. We also discussed the option of pursuing a cord block to assess whether or not this alleviates his pain, he will consider which can be performed at next visit Continue supportive care with NSAIDs as tolerated, supportive undergarments, etc. -CT pelvis   Return for ipss, right cord block.  Hollice Espy, MD  [facility] [address], [place], [place] [address] [phone]  I spent 25 min with this patient of which greater than 50% was spent in counseling and coordination of care with the patient.

## 2018-03-24 ENCOUNTER — Other Ambulatory Visit: Payer: Self-pay | Admitting: Family Medicine

## 2018-03-24 DIAGNOSIS — N401 Enlarged prostate with lower urinary tract symptoms: Secondary | ICD-10-CM

## 2018-03-24 MED ORDER — TAMSULOSIN HCL 0.4 MG PO CAPS: 0.4000 mg | ORAL_CAPSULE | Freq: Every day | ORAL | 11 refills | Status: DC

## 2018-03-24 MED ORDER — FINASTERIDE 5 MG PO TABS: 5.0000 mg | ORAL_TABLET | Freq: Every day | ORAL | 11 refills | Status: DC

## 2018-04-09 ENCOUNTER — Other Ambulatory Visit: Payer: Self-pay

## 2018-04-09 DIAGNOSIS — N281 Cyst of kidney, acquired: Secondary | ICD-10-CM

## 2018-04-10 ENCOUNTER — Ambulatory Visit (INDEPENDENT_AMBULATORY_CARE_PROVIDER_SITE_OTHER): Payer: 59 | Admitting: Urology

## 2018-04-10 ENCOUNTER — Other Ambulatory Visit
Admission: RE | Admit: 2018-04-10 | Discharge: 2018-04-10 | Disposition: A | Discharge status: Home / Self Care | Payer: Commercial Managed Care - HMO | Source: Ambulatory Visit | Attending: Urology | Admitting: Urology

## 2018-04-10 ENCOUNTER — Ambulatory Visit
Admission: RE | Admit: 2018-04-10 | Discharge: 2018-04-10 | Disposition: A | Discharge status: Home / Self Care | Payer: Commercial Managed Care - HMO | Source: Ambulatory Visit | Attending: Urology | Admitting: Urology

## 2018-04-10 ENCOUNTER — Encounter: Payer: Self-pay | Admitting: Urology

## 2018-04-10 VITALS — BP 150/96 | HR 80 | Ht 68.0 in | Wt 160.0 lb

## 2018-04-10 DIAGNOSIS — N433 Hydrocele, unspecified: Secondary | ICD-10-CM | POA: Diagnosis not present

## 2018-04-10 DIAGNOSIS — N491 Inflammatory disorders of spermatic cord, tunica vaginalis and vas deferens: Secondary | ICD-10-CM | POA: Insufficient documentation

## 2018-04-10 DIAGNOSIS — I700 Atherosclerosis of aorta: Secondary | ICD-10-CM | POA: Diagnosis not present

## 2018-04-10 DIAGNOSIS — M5136 Other intervertebral disc degeneration, lumbar region: Secondary | ICD-10-CM | POA: Insufficient documentation

## 2018-04-10 DIAGNOSIS — N281 Cyst of kidney, acquired: Secondary | ICD-10-CM

## 2018-04-10 DIAGNOSIS — N401 Enlarged prostate with lower urinary tract symptoms: Secondary | ICD-10-CM

## 2018-04-10 DIAGNOSIS — N138 Other obstructive and reflux uropathy: Secondary | ICD-10-CM

## 2018-04-10 LAB — CREATININE, SERUM: Creatinine, Ser: 1.25 mg/dL — ABNORMAL HIGH (ref 0.61–1.24)

## 2018-04-10 MED ORDER — IOHEXOL 300 MG/ML  SOLN
100.0000 mL | Freq: Once | INTRAMUSCULAR | Status: AC | PRN
  Administered 2018-04-10: 100 mL via INTRAVENOUS

## 2018-04-10 NOTE — Progress Notes (Signed)
04/10/2018 1:14 PM   Brandon Mathews 10/11/1955 [id number]  Referring provider: Ezequiel Kayser, MD [facility] [place], [place] [address]  Chief Complaint  Patient presents with  . epididymal cyst    HPI: 62 year old male with a personal history of BPH and left of a situs who returns today for routine follow-up.  Since his last visit, he started taking high-dose NSAIDs for a week and is slowly backed off.  He is currently taking 400 mg twice a day.  Since starting these anti-inflammatories, he reports that the pain in his right vas /inguinal area has improved dramatically.  The pain was primarily just after intercourse or ejaculation.  As a precaution given the unusual exam findings and history, he did have a pelvic CT with contrast done this morning which shows prostamegaly with an approximately 72 g prostate.  He has a small right hydrocele.  Notably, there is asymmetric enlargement of the right seminal vesicle which appears to be stable and unchanged from 2017.  There are no other significant concerning findings.  Since his last visit, he is also stopped taking his Flomax and finasteride.  He is noticed no difference in his voiding symptoms today.  He would like to continue to hold this medication for the time being.   PMH: Past Medical History:  Diagnosis Date  . Arthritis   . BPH (benign prostatic hyperplasia)   . Heartburn   . Hematuria   . HTN (hypertension)   . Melanoma [facility])     Surgical History: Past Surgical History:  Procedure Laterality Date  . MOHS SURGERY      Home Medications:  Allergies as of 04/10/2018   No Known Allergies     Medication List        Accurate as of 04/10/18  1:14 PM. Always use your most recent med list.          CIALIS 20 MG tablet Generic drug:  tadalafil Take by mouth. Reported on 12/22/2015   clotrimazole-betamethasone cream Commonly known as:  LOTRISONE   lisinopril 20 MG tablet Commonly  known as:  PRINIVIL,ZESTRIL TAKE 1 TABLET (20 MG TOTAL) BY MOUTH ONCE DAILY FOR BLOOD PRESSURE   MULTI-VITAMINS Tabs Take by mouth. Reported on 11/28/2015   triamcinolone ointment 0.1 % Commonly known as:  KENALOG       Allergies: No Known Allergies  Family History: Family History  Problem Relation Age of Onset  . Breast cancer Sister   . Kidney disease Neg Hx   . Prostate cancer Neg Hx   . Kidney cancer Neg Hx   . Bladder Cancer Neg Hx     Social History:  reports that he has quit smoking. He has never used smokeless tobacco. He reports that he drinks alcohol. He reports that he does not use drugs.  ROS: UROLOGY Frequent Urination?: No Hard to postpone urination?: No Burning/pain with urination?: No Get up at night to urinate?: No Leakage of urine?: No Urine stream starts and stops?: No Trouble starting stream?: Yes Do you have to strain to urinate?: No Blood in urine?: No Urinary tract infection?: No Sexually transmitted disease?: No Injury to kidneys or bladder?: No Painful intercourse?: Yes Weak stream?: Yes Erection problems?: No Penile pain?: No  Gastrointestinal Nausea?: No Vomiting?: No Indigestion/heartburn?: Yes Diarrhea?: No Constipation?: No  Constitutional Fever: No Night sweats?: No Weight loss?: No Fatigue?: No  Skin Skin rash/lesions?: No Itching?: No  Eyes Blurred vision?: No Double vision?: No  Ears/Nose/Throat Sore throat?: No Sinus problems?: No  Hematologic/Lymphatic Swollen glands?: No Easy bruising?: No  Cardiovascular Leg swelling?: No Chest pain?: No  Respiratory Cough?: No Shortness of breath?: No  Endocrine Excessive thirst?: No  Musculoskeletal Back pain?: Yes Joint pain?: No  Neurological Headaches?: No Dizziness?: No  Psychologic Depression?: No Anxiety?: No  Physical Exam: BP (!) 150/96 (BP Location: Left Arm, Patient Position: Sitting, Cuff Size: Normal)   Pulse 80   Ht 5\' 8"  (1.727 m)    Wt 160 lb (72.6 kg)   BMI 24.33 kg/m   Constitutional:  Alert and oriented, No acute distress. HEENT: Bent AT, moist mucus membranes.  Trachea midline, no masses. Cardiovascular: No clubbing, cyanosis, or edema. Respiratory: Normal respiratory effort, no increased work of breathing. Skin: No rashes, bruises or suspicious lesions. Neurologic: Grossly intact, no focal deficits, moving all 4 extremities. Psychiatric: Normal mood and affect.  Laboratory Data: Lab Results  Component Value Date   CREATININE 1.25 (H) 04/10/2018    Urinalysis    Component Value Date/Time   COLORURINE YELLOW 12/20/2016 0745   APPEARANCEUR CLEAR 12/20/2016 0745   APPEARANCEUR Clear 12/22/2015 1058   LABSPEC >1.030 (H) 12/20/2016 0745   PHURINE 5.0 12/20/2016 0745   GLUCOSEU NEGATIVE 12/20/2016 0745   HGBUR NEGATIVE 12/20/2016 0745   BILIRUBINUR NEGATIVE 12/20/2016 0745   BILIRUBINUR Negative 12/22/2015 1058   KETONESUR NEGATIVE 12/20/2016 0745   PROTEINUR NEGATIVE 12/20/2016 0745   NITRITE NEGATIVE 12/20/2016 0745   LEUKOCYTESUR NEGATIVE 12/20/2016 0745   LEUKOCYTESUR Negative 12/22/2015 1058    Lab Results  Component Value Date   LABMICR See below: 12/22/2015   WBCUA 0-5 12/22/2015   RBCUA 0-2 12/22/2015   LABEPIT None seen 12/22/2015   MUCUS Present (A) 12/22/2015   BACTERIA NONE SEEN 12/20/2016    Pertinent Imaging: CLINICAL DATA:  dilated/inflamed right vas. Evaluate for intrapelvic mass  EXAM: CT PELVIS WITH CONTRAST  TECHNIQUE: Multidetector CT imaging of the pelvis was performed using the standard protocol following the bolus administration of intravenous contrast.  CONTRAST:  167mL OMNIPAQUE IOHEXOL 300 MG/ML  SOLN  COMPARISON:  CT AP 12/08/2015  FINDINGS: Urinary Tract:  No abnormality visualized.  Bowel:  Unremarkable visualized pelvic bowel loops.  Vascular/Lymphatic: Aortic atherosclerosis noted. No abdominopelvic adenopathy.  Reproductive: The prostate  gland measures 4.8 x 5.3 by 5.4 cm (volume = 72 cm^3).  Asymmetric enlargement of the left seminal vesicle is identified which appears similar to 12/08/2015.  Small right hydrocele noted.  Other: No free fluid or fluid collections identified within the abdomen or pelvis.  Musculoskeletal: Degenerative disc disease identified within the lumbar spine.  IMPRESSION: 1. Prostate gland enlargement with chronic asymmetric enlargement of the left seminal vesicle, unchanged from 12/08/2015. 2. Small right hydrocele. 3. Lumbar degenerative disc disease 4.  Aortic Atherosclerosis (ICD10-I70.0).   Electronically Signed   By: Kerby Moors M.D.   On: 04/10/2018 10:42  CT scan personally reviewed, agree with radiologic interpretation.  Assessment & Plan:    1. Vasitis Improving with NSAIDS and supportive care Advised to continue to decrease dose down to 200 mg bid for the next month then stop Return or contact our office if symptoms recur Cord block deferred as he is improving clinically CT scan was personally reviewed today and with the patient, agree with radiologic interpretation with no significant intrapelvic pathology identified, stably enlarged ejaculatory duct on contralateral side (? Partial ejaculatory duct obstruction)  2. BPH with obstruction/lower urinary tract symptoms Prostamegaly, relatively asymptomatic Patient has  decided to stop his BPH medicines and see how it goes We discussed the risks and benefits of this again today   We will reassess him in 6 months with IPSS/PVR/ DRE/ PSA   Hollice Espy, MD  [facility] [address] [place] [facility] [place], [place] [address] [phone]

## 2018-04-10 NOTE — Patient Instructions (Signed)
Motrin/ Ibuprofen 200 mg twice daily for next month  If pain subsides, then go ahead and stop meds  Stop finasteride/ flomax  Reassess in 6 months  Send me a message via Mychart if pain returns

## 2018-04-20 IMAGING — CT CT ABD-PEL WO/W CM
3 of 10 series · 12 of 46 positions shown, 18 images · IV contrast (isovue)
Comparison: None.

CLINICAL DATA: Hematuria workup.  Hematuria x[DATE]

EXAM:
CT ABDOMEN AND PELVIS WITHOUT AND WITH CONTRAST
TECHNIQUE: Multidetector CT imaging of the abdomen and pelvis was performed
following the standard protocol before and following the bolus
administration of intravenous contrast.
CONTRAST:  125 cc of Isovue 370

[Series 2: hematuria > 45 wo · axial · 0.72mm/px · z∈[-879,-539]mm · 8 of 88 slices shown, 13 images]
[im 10/88  soft-tissue]
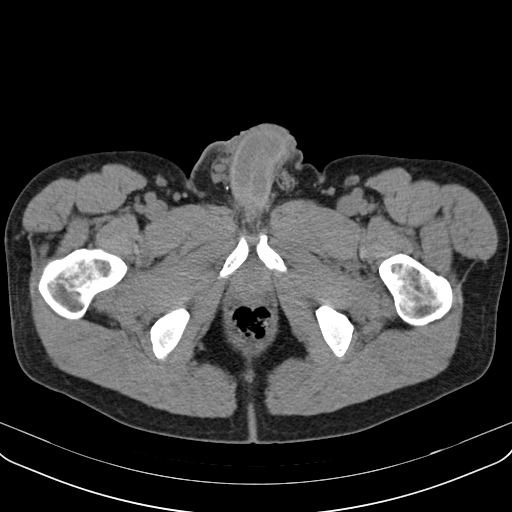
[im 10/88  bone]
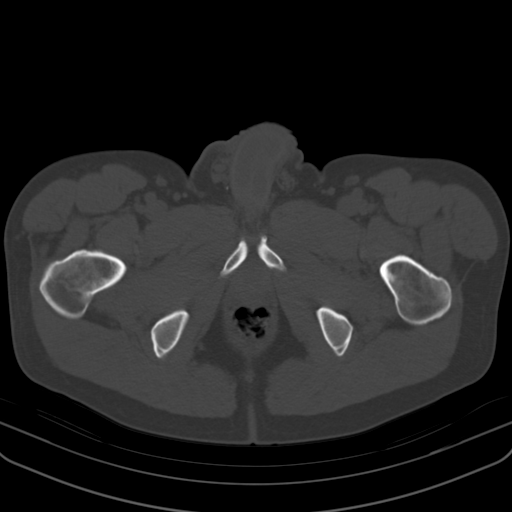
[im 20/88  soft-tissue]
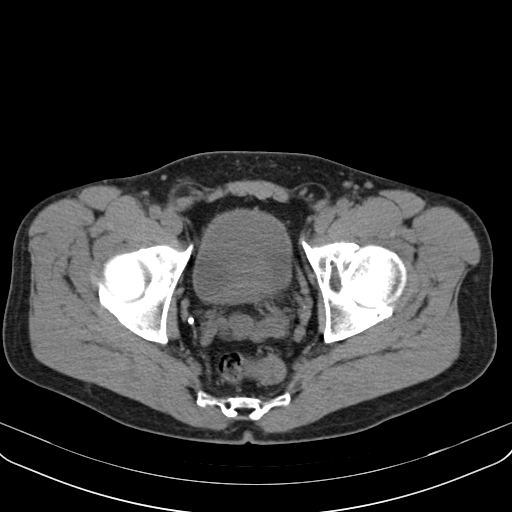
[im 30/88  soft-tissue]
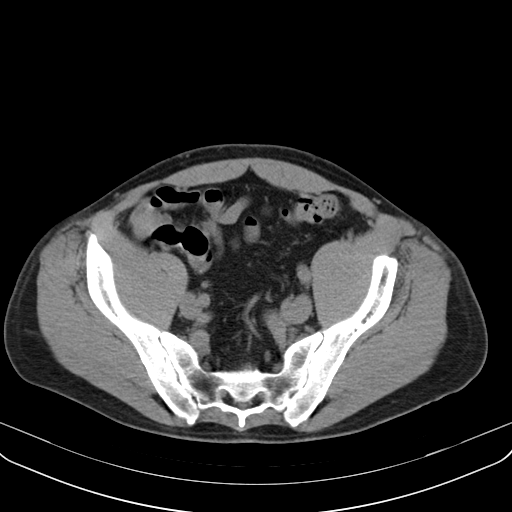
[im 39/88  soft-tissue]
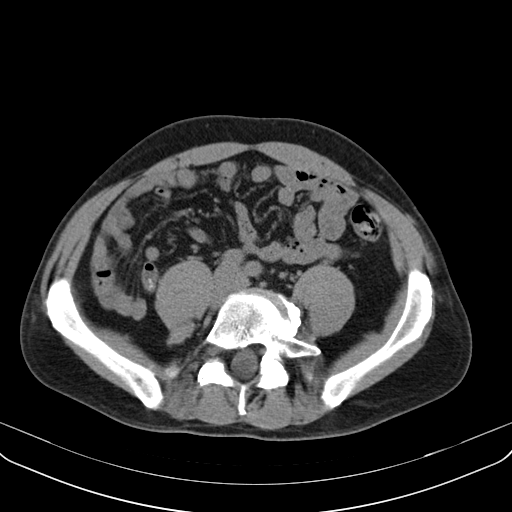
[im 49/88  soft-tissue]
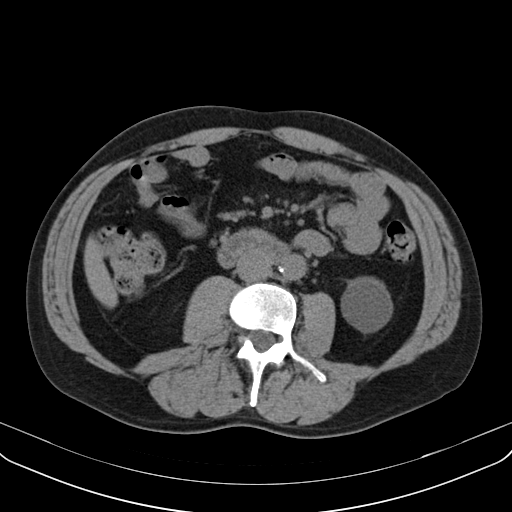
[im 49/88  lung]
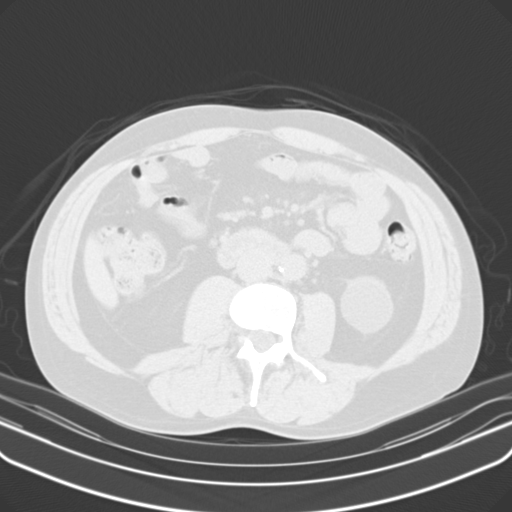
[im 59/88  soft-tissue]
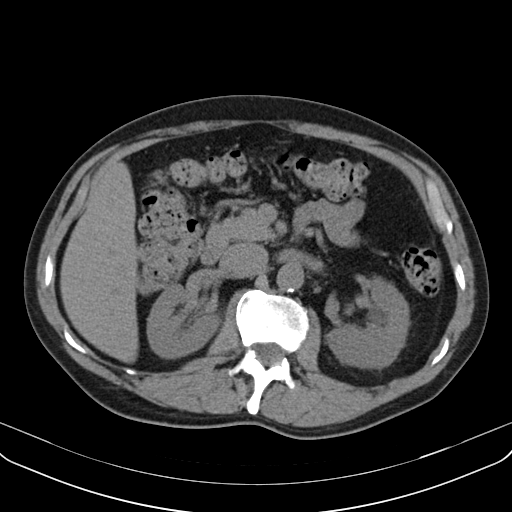
[im 59/88  lung]
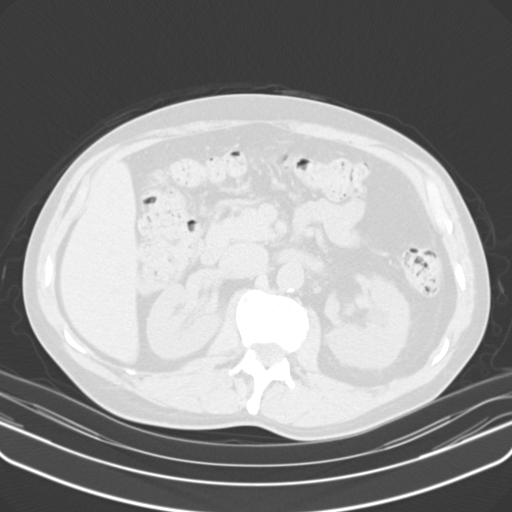
[im 68/88  soft-tissue]
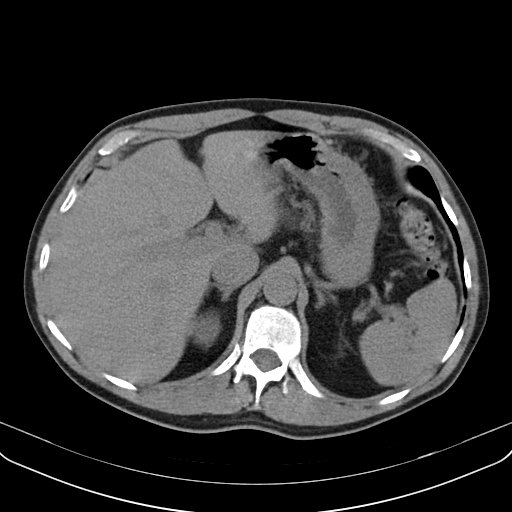
[im 68/88  lung]
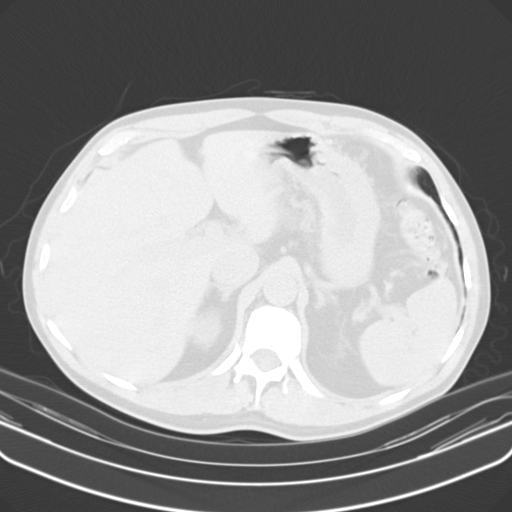
[im 78/88  soft-tissue]
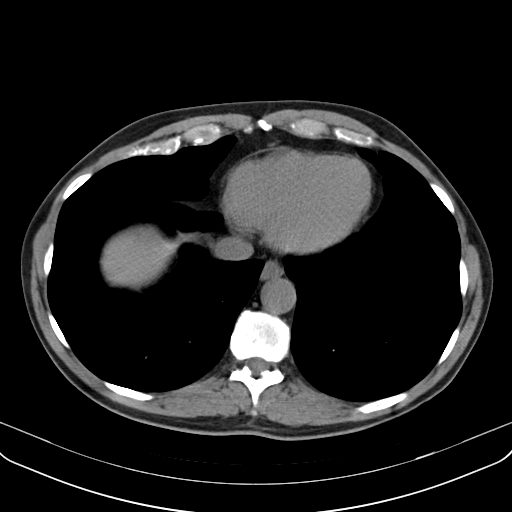
[im 78/88  lung]
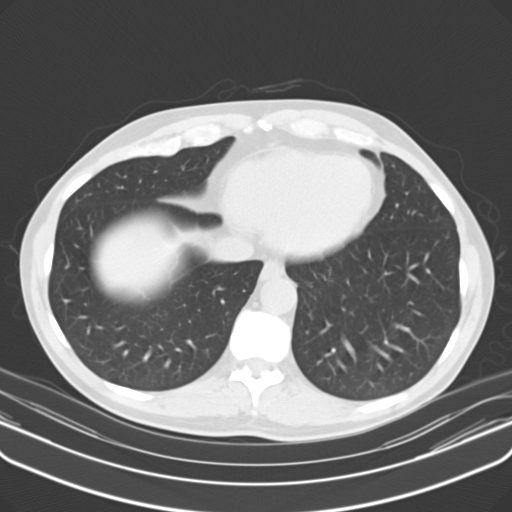

[Series 4: hematuria < 45 with 100s · axial · 0.72mm/px · z∈[-879,-829]mm · 2 of 88 slices shown]
[im 10/88  soft-tissue]
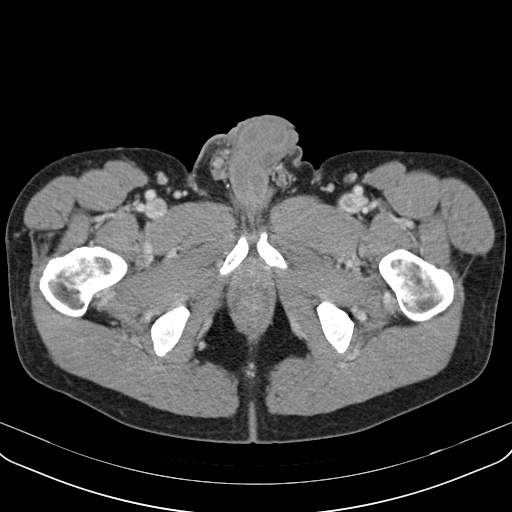
[im 20/88  soft-tissue]
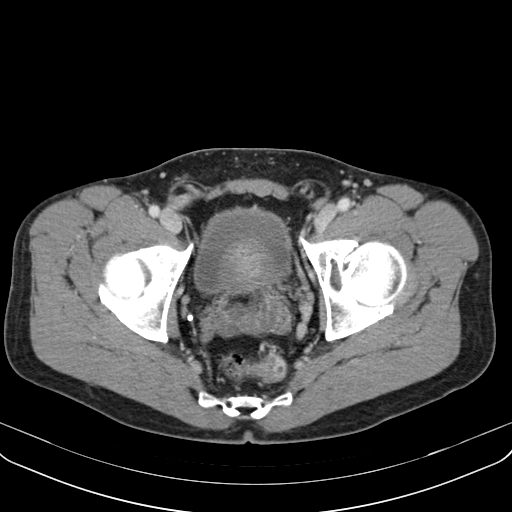

[Series 606: delay coronal · coronal · delayed · 0.87mm/px · 2 of 104 slices shown, 3 images]
[im 35/104  soft-tissue]
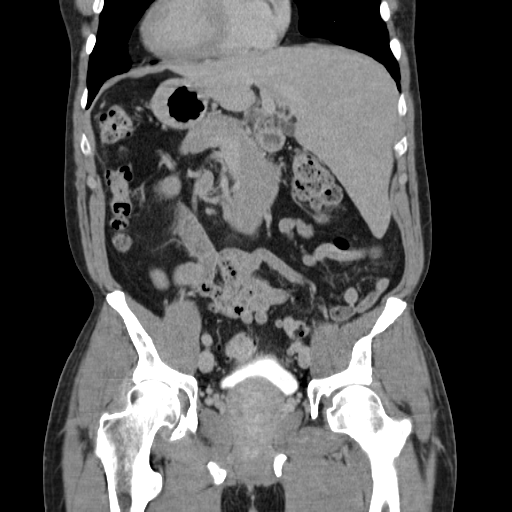
[im 35/104  bone]
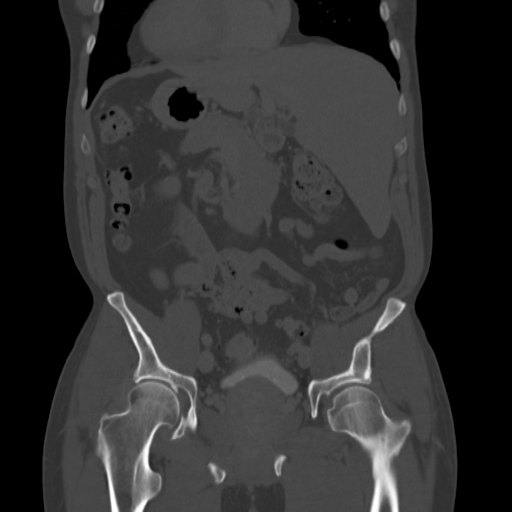
[im 69/104  soft-tissue]
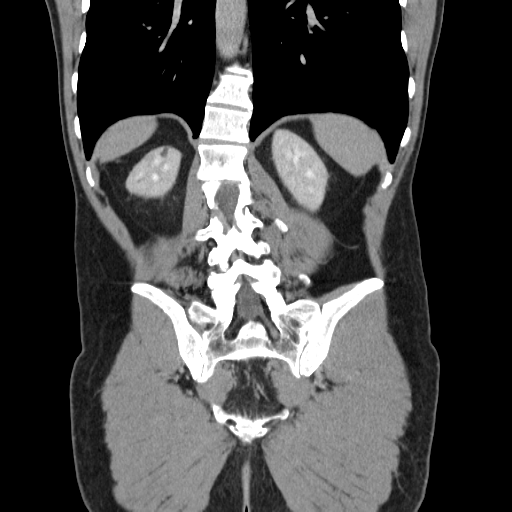

[12 of 46 positions shown; findings below may reference images not displayed]

FINDINGS: Lower chest:  No pleural fluid.  The lung bases appear clear.

Hepatobiliary: There is no focal liver abnormality. The gallbladder
is within normal limits. No biliary dilatation.

Pancreas: Unremarkable appearance of the pancreas.

Spleen: Normal appearance of the spleen.

Adrenals/Urinary Tract: The adrenal glands appear normal. There is
no right renal calculi identified. Cyst arising from the inferior
pole of the left kidney is identified measuring 3.7 cm, image 41 of
series 4. Several additional cysts are noted within the left kidney.
Arising from the upper pole of the left kidney is a hyperdense
structure which measures 8 mm. This is too small to reliably
characterize but measures approximately 65 Hounsfield units on the
precontrast images. No kidney stones identified. No bladder calculi
or ureteral calculi noted. No hydronephrosis. On the delayed images
there is symmetric excretion of contrast material by both kidneys.

Stomach/Bowel: The stomach appears normal. The small bowel loops
have a normal course and caliber. Without evidence for obstruction.
The appendix is visualized and appears normal. Distal colonic
diverticula noted without acute inflammation. No pathologic
dilatation of the colon.

Vascular/Lymphatic: Calcified atherosclerotic disease involves the
abdominal aorta. No aneurysm. No enlarged retroperitoneal or
mesenteric adenopathy. No enlarged pelvic or inguinal lymph nodes.

Reproductive: Prostate gland is enlarged. Symmetric enlargement of
the seminal vesicles.

Other: No ascites identified.  No focal fluid collections.

Musculoskeletal: No aggressive lytic or sclerotic bone lesions.
There is a scoliosis deformity which is convex towards the left.
Degenerative disc disease noted within the lower lumbar spine.
IMPRESSION: 1. No acute findings within the abdomen or pelvis. No
nephrolithiasis or hydronephrosis.
2. Prostate gland enlargement.
3. Kidney cysts.
4. There is a hyperdense structure arising from the upper pole the
left kidney which is too small to reliably characterize measuring 8
mm.
5. Aortic atherosclerosis
6. Scoliosis and degenerative disc disease.

## 2018-05-11 DIAGNOSIS — D3132 Benign neoplasm of left choroid: Secondary | ICD-10-CM | POA: Diagnosis not present

## 2018-05-12 DIAGNOSIS — D125 Benign neoplasm of sigmoid colon: Secondary | ICD-10-CM | POA: Diagnosis not present

## 2018-05-12 DIAGNOSIS — Z1211 Encounter for screening for malignant neoplasm of colon: Secondary | ICD-10-CM | POA: Diagnosis not present

## 2018-05-12 DIAGNOSIS — K635 Polyp of colon: Secondary | ICD-10-CM | POA: Diagnosis not present

## 2018-05-12 DIAGNOSIS — D123 Benign neoplasm of transverse colon: Secondary | ICD-10-CM | POA: Diagnosis not present

## 2018-05-12 LAB — HM COLONOSCOPY

## 2018-09-28 DIAGNOSIS — Z86018 Personal history of other benign neoplasm: Secondary | ICD-10-CM | POA: Diagnosis not present

## 2018-09-28 DIAGNOSIS — L578 Other skin changes due to chronic exposure to nonionizing radiation: Secondary | ICD-10-CM | POA: Diagnosis not present

## 2018-09-28 DIAGNOSIS — Z85820 Personal history of malignant melanoma of skin: Secondary | ICD-10-CM | POA: Diagnosis not present

## 2018-10-23 ENCOUNTER — Ambulatory Visit: Payer: 59 | Admitting: Urology

## 2018-12-18 ENCOUNTER — Ambulatory Visit (INDEPENDENT_AMBULATORY_CARE_PROVIDER_SITE_OTHER): Payer: 59 | Admitting: Urology

## 2018-12-18 ENCOUNTER — Other Ambulatory Visit: Payer: Self-pay

## 2018-12-18 ENCOUNTER — Encounter: Payer: Self-pay | Admitting: Urology

## 2018-12-18 VITALS — BP 134/78 | HR 76 | Ht 68.0 in | Wt 160.0 lb

## 2018-12-18 DIAGNOSIS — N491 Inflammatory disorders of spermatic cord, tunica vaginalis and vas deferens: Secondary | ICD-10-CM | POA: Diagnosis not present

## 2018-12-18 DIAGNOSIS — N138 Other obstructive and reflux uropathy: Secondary | ICD-10-CM | POA: Diagnosis not present

## 2018-12-18 DIAGNOSIS — N401 Enlarged prostate with lower urinary tract symptoms: Secondary | ICD-10-CM | POA: Diagnosis not present

## 2018-12-18 LAB — BLADDER SCAN AMB NON-IMAGING

## 2018-12-18 NOTE — Progress Notes (Signed)
12/18/2018 9:15 AM   Brandon Mathews 12-05-1955 [id number]  Referring provider: Ezequiel Kayser, MD [facility] [place],  [place] [address]  Chief Complaint  Patient presents with  . Benign Prostatic Hypertrophy    Follow up    HPI: Brandon Mathews is a 63 y.o. male with PMH right vasitis nodosum and BPH who was last seen 04/10/2018 by Dr. Erlene Quan.  He returns today for routine follow-up.  At that time, patient had completed a course of ibuprofen for the vasitis and reported some symptomatic improvement. He decided to discontinue tamsulosin and finasteride at that time, stating he was unsure how much these medications were helping.  Today, patient reports persistent right sided scrotal pain after sex. He states that the pain has improved slightly from its worst, however it is still bothersome. He reports that he dislikes taking pills, so he has not consistently taken anti-inflammatories to control his pain.  Symptoms are exacerbated with sexual activity and rolling over in bed.  He reports minimal LUTS since stopping the finasteride and tamsulosin. I-PSS today is 5/3; PVR is 41mL. He states his symptoms are insufficiently bothersome to restart either medication. PSA not drawn today; patient prefers to wait until his August physical with his PCP for this lab.  PMH: Past Medical History:  Diagnosis Date  . Arthritis   . BPH (benign prostatic hyperplasia)   . Heartburn   . Hematuria   . HTN (hypertension)   . Melanoma [facility])     Surgical History: Past Surgical History:  Procedure Laterality Date  . MOHS SURGERY      Home Medications:  Allergies as of 12/18/2018   No Known Allergies     Medication List       Accurate as of December 18, 2018  9:15 AM. If you have any questions, ask your nurse or doctor.        Cialis 20 MG tablet Generic drug: tadalafil Take by mouth. Reported on 12/22/2015   clotrimazole-betamethasone cream Commonly known  as: LOTRISONE   lisinopril 20 MG tablet Commonly known as: ZESTRIL TAKE 1 TABLET (20 MG TOTAL) BY MOUTH ONCE DAILY FOR BLOOD PRESSURE   Multi-Vitamins Tabs Take by mouth. Reported on 11/28/2015   triamcinolone ointment 0.1 % Commonly known as: KENALOG      Allergies: No Known Allergies  Family History: Family History  Problem Relation Age of Onset  . Breast cancer Sister   . Kidney disease Neg Hx   . Prostate cancer Neg Hx   . Kidney cancer Neg Hx   . Bladder Cancer Neg Hx    Social History:  reports that he has quit smoking. He has never used smokeless tobacco. He reports current alcohol use. He reports that he does not use drugs.  ROS: UROLOGY Frequent Urination?: No Hard to postpone urination?: No Burning/pain with urination?: No Get up at night to urinate?: No Urine stream starts and stops?: No Trouble starting stream?: No Do you have to strain to urinate?: No Blood in urine?: No Urinary tract infection?: No Sexually transmitted disease?: No Injury to kidneys or bladder?: No Painful intercourse?: Yes Weak stream?: No Erection problems?: No Penile pain?: No  Gastrointestinal Nausea?: No Vomiting?: No Indigestion/heartburn?: No Diarrhea?: No Constipation?: No  Constitutional Fever: No Night sweats?: No Weight loss?: No Fatigue?: No  Skin Skin rash/lesions?: No Itching?: Yes  Eyes Blurred vision?: No Double vision?: No  Ears/Nose/Throat Sore throat?: No Sinus problems?: No  Hematologic/Lymphatic Swollen glands?:  No Easy bruising?: No  Cardiovascular Leg swelling?: No Chest pain?: No  Respiratory Cough?: No Shortness of breath?: No  Endocrine Excessive thirst?: No  Musculoskeletal Back pain?: No Joint pain?: No  Neurological Headaches?: No Dizziness?: No  Psychologic Depression?: No Anxiety?: No  Physical Exam: BP 134/78   Pulse 76   Ht 5\' 8"  (1.727 m)   Wt 160 lb (72.6 kg)   BMI 24.33 kg/m   Constitutional:  Alert  and oriented, No acute distress. HEENT: [place] AT, moist mucus membranes.  Trachea midline, no masses. Cardiovascular: No clubbing, cyanosis, or edema. Respiratory: Normal respiratory effort, no increased work of breathing. GU: Thickening of the right vas, otherwise normal.  Bilateral descended testicles.  No masses, nontender.  Circumcised phallus with orthotopic meatus. DRE: negative without mass, lesions or tenderness, sphincter tone normal. Skin: No rashes, bruises or suspicious lesions. Neurologic: Grossly intact, no focal deficits, moving all 4 extremities. Psychiatric: Normal mood and affect.  Assessment & Plan:    1. BPH with obstruction/lower urinary tract symptoms Patient reports minimal LUTS exacerbation since stopping tamsulosin and finasteride. PVR of 39mL unconcerning for incomplete bladder emptying. Counseled patient that he does not need to be on these medications if he does not feel they are helping him. Informed patient that if he does decide to resume therapy, tamsulosin will relieve his symptoms faster than finasteride. Patient expressed understanding.   Patient prefers to defer PSA testing today, stating his PCP will draw it in August at his annual physical. Counseled the patient that he must call our office to notify us when his PSA is drawn so that we can review it for ongoing management of his BPH. Patient expressed understanding of this plan. - Bladder Scan (Post Void Residual) in office - PSA; Future (with PCP) - Return in 1 year for PVR/PSA/IPSS  2. Vasitis (right) Counseled patient on the chronic inflammatory nature of this condition. Encouraged him to use anti-inflammatory drugs including ibuprofen to manage his symptoms. Counseled him on alternative treatment options including cord block and surgical excision. Patient prefers to defer these treatments.  Debroah Loop, PA-C  [facility] [address] [place], [place]  [address] [phone]  Patient was seen and examined today in conjunction with Debroah Loop.  Agree with her interpretation.  Options were discussed with patient personally.  He prefers to continue conservative management.  We will see him back in a month or sooner as needed.  Hollice Espy, MD

## 2019-04-25 DIAGNOSIS — N183 Chronic kidney disease, stage 3 unspecified: Secondary | ICD-10-CM | POA: Insufficient documentation

## 2019-06-08 ENCOUNTER — Telehealth: Payer: Self-pay | Admitting: Urology

## 2019-06-08 DIAGNOSIS — N138 Other obstructive and reflux uropathy: Secondary | ICD-10-CM

## 2019-06-08 NOTE — Telephone Encounter (Signed)
Pt called and states that he has spoke to Dr Raechel Ache and he can not just release the PSA labs to her, that she will have to look them up in [place].

## 2019-06-10 NOTE — Telephone Encounter (Signed)
Spoke with patient-wanted you to know he had PSA drawn in August. Results are in [place].

## 2019-07-19 NOTE — Telephone Encounter (Signed)
Please let this patient know that I reviewed his PSA was mildly elevated and it was significantly elevated from his previous values.  He is scheduled to see [place] in a few months, we can recheck his PSA at that time to ensure that is coming down.  Hollice Espy, MD

## 2019-07-20 NOTE — Telephone Encounter (Signed)
Left VM with details, asked to return call with any questions.

## 2019-07-20 NOTE — Addendum Note (Signed)
Addended by: Verlene Mayer A on: 07/20/2019 02:04 PM   Modules accepted: Orders

## 2019-07-20 NOTE — Addendum Note (Signed)
Addended by: Verlene Mayer A on: 07/20/2019 02:05 PM   Modules accepted: Orders

## 2019-12-23 NOTE — Progress Notes (Signed)
12/24/2019 10:44 AM   Brandon Mathews 1956/01/28 [id number]  Referring provider: Ezequiel Kayser, MD [facility] [place],  [place] [address] Chief Complaint  Patient presents with   Benign Prostatic Hypertrophy    follow up    HPI: Brandon Mathews is a 64 y.o. male with PMH right vasitis nodosum and BPH returns today for a routine follow-up.   UA on 01/21/2019 at [facility] showed trace blood. No associated urine culture.   Most recent PSA was 4.33 as of 01/21/2019.  He is voiding well on occasion. Sometimes his stream is good.   Reports occasional scrotal pain after intercourse.  Better than previous year.  Still considering surgical intervention for this issue, see previous notes for details.    PSA trend: 11/28/2015: 2.6 12/20/2016: 1.43 01/19/2018: 1.03 01/21/2019: 4.33     IPSS    Row Name 12/24/19 0900         International Prostate Symptom Score   How often have you had the sensation of not emptying your bladder? Less than 1 in 5     How often have you had to urinate less than every two hours? Less than 1 in 5 times     How often have you found you stopped and started again several times when you urinated? Not at All     How often have you found it difficult to postpone urination? Not at All     How often have you had a weak urinary stream? About half the time     How often have you had to strain to start urination? Not at All     How many times did you typically get up at night to urinate? None     Total IPSS Score 5       Quality of Life due to urinary symptoms   If you were to spend the rest of your life with your urinary condition just the way it is now how would you feel about that? Mostly Satisfied           Score:  1-7 Mild 8-19 Moderate 20-35 Severe   PMH: Past Medical History:  Diagnosis Date   Arthritis    BPH (benign prostatic hyperplasia)    Heartburn    Hematuria    HTN (hypertension)    Melanoma  ([place])     Surgical History: Past Surgical History:  Procedure Laterality Date   MOHS SURGERY      Home Medications:  Allergies as of 12/24/2019   No Known Allergies     Medication List       Accurate as of December 24, 2019 10:44 AM. If you have any questions, ask your nurse or doctor.        STOP taking these medications   lisinopril 20 MG tablet Commonly known as: ZESTRIL Stopped by: Hollice Espy, MD     TAKE these medications   Cialis 20 MG tablet Generic drug: tadalafil Take by mouth. Reported on 12/22/2015   clotrimazole-betamethasone cream Commonly known as: LOTRISONE   losartan 50 MG tablet Commonly known as: COZAAR Take by mouth.   Multi-Vitamins Tabs Take by mouth. Reported on 11/28/2015   triamcinolone ointment 0.1 % Commonly known as: KENALOG       Allergies: No Known Allergies  Family History: Family History  Problem Relation Age of Onset   Breast cancer Sister    Kidney disease Neg Hx    Prostate cancer Neg Hx  Kidney cancer Neg Hx    Bladder Cancer Neg Hx     Social History:  reports that he has quit smoking. He has never used smokeless tobacco. He reports current alcohol use. He reports that he does not use drugs.   Physical Exam: BP (!) 137/95    Pulse 69    Ht 5\' 5"  (1.651 m)    Wt 168 lb (76.2 kg)    BMI 27.96 kg/m   Constitutional:  Alert and oriented, No acute distress. HEENT: [place] AT, moist mucus membranes.  Trachea midline, no masses. Cardiovascular: No clubbing, cyanosis, or edema. Respiratory: Normal respiratory effort, no increased work of breathing. GI: Abdomen is soft, nontender, nondistended, no abdominal masses GU: No CVA tenderness. Chronic thickening and slight tenderness of the right vas. Rectal: Normal sphincter tone, slightly enlarged prostate, no nodules/tenderness.  Skin: No rashes, bruises or suspicious lesions. Neurologic: Grossly intact, no focal deficits, moving all 4 extremities. Psychiatric: Normal  mood and affect.    Pertinent Imaging: Results for orders placed or performed in visit on 12/24/19  BLADDER SCAN AMB NON-IMAGING  Result Value Ref Range   Scan Result 53ml      Assessment & Plan:    1. BPH w/ obstruction/lower urinary tract symptoms Symptoms are stable, patient does not want any intervention at this time. Adequate emptying.  PVR is 24 mL.  IPSS score is 5, mild.  2. Chronic right vasitis  On exam today there was chronic thickening and slight tenderness of the right vas.   We discussed alternative surgical intervention options along with the risk and benefits. Counseled patient on the chronic inflammatory nature of this condition. Encouraged him to use anti-inflammatory drugs including ibuprofen to manage his symptoms. Counseled him on alternative treatment options including cord block and surgical excision. Patient will consider options.   3. History of elevated PSA/PSA screening -PSA is rising  -Will check PSA today   Follow up in 1 year.   [place] [address], [place], [place] [address] [phone]  I, Selena Batten, am acting as a scribe for Dr. Hollice Espy.  I have reviewed the above documentation for accuracy and completeness, and I agree with the above.   Hollice Espy, MD  I spent 30 total minutes on the day of the encounter including pre-visit review of the medical record, face-to-face time with the patient, and post visit ordering of labs/imaging/tests.

## 2019-12-24 ENCOUNTER — Encounter: Payer: Self-pay | Admitting: Urology

## 2019-12-24 ENCOUNTER — Other Ambulatory Visit: Payer: Self-pay

## 2019-12-24 ENCOUNTER — Other Ambulatory Visit
Admission: RE | Admit: 2019-12-24 | Discharge: 2019-12-24 | Disposition: A | Discharge status: Home / Self Care | Payer: 59 | Attending: Urology | Admitting: Urology

## 2019-12-24 ENCOUNTER — Ambulatory Visit (INDEPENDENT_AMBULATORY_CARE_PROVIDER_SITE_OTHER): Payer: 59 | Admitting: Urology

## 2019-12-24 VITALS — BP 137/95 | HR 69 | Ht 65.0 in | Wt 168.0 lb

## 2019-12-24 DIAGNOSIS — N401 Enlarged prostate with lower urinary tract symptoms: Secondary | ICD-10-CM | POA: Diagnosis not present

## 2019-12-24 DIAGNOSIS — N138 Other obstructive and reflux uropathy: Secondary | ICD-10-CM

## 2019-12-24 LAB — BLADDER SCAN AMB NON-IMAGING

## 2019-12-24 LAB — PSA: Prostatic Specific Antigen: 3.88 ng/mL (ref 0.00–4.00)

## 2019-12-27 ENCOUNTER — Telehealth: Payer: Self-pay

## 2019-12-27 NOTE — Telephone Encounter (Signed)
-----   Message from Hollice Espy, MD sent at 12/27/2019  4:04 PM EDT ----- PSA 3.88, trending back down.  Good news.   Hollice Espy, MD

## 2019-12-27 NOTE — Telephone Encounter (Signed)
Pt aware of result. Patient wants to know if Dr Erlene Quan has any answers as to why his PSA has changed

## 2019-12-28 NOTE — Telephone Encounter (Signed)
There is normal fluctuation as well as change based on age, gland size, inflammation, mechanical manipulation, etc.  Overall, looking for marked rises or overall upward trends.  Right now, I am not concerned about your PSA will continue to follow annually.

## 2019-12-29 NOTE — Telephone Encounter (Signed)
Patient informed, voiced understanding.  °

## 2020-01-21 LAB — BASIC METABOLIC PANEL
BUN: 20 (ref 4–21)
CO2: 31 — AB (ref 13–22)
Chloride: 107 (ref 99–108)
Creatinine: 1.3 (ref 0.6–1.3)
Glucose: 89
Potassium: 4.4 (ref 3.4–5.3)
Sodium: 139 (ref 137–147)

## 2020-01-21 LAB — CBC AND DIFFERENTIAL
HCT: 43 (ref 41–53)
Hemoglobin: 14.6 (ref 13.5–17.5)
Platelets: 214 (ref 150–399)
WBC: 6.4

## 2020-01-21 LAB — PSA: PSA: 4.33

## 2020-01-21 LAB — MICROALBUMIN, URINE: Microalb, Ur: 172.2

## 2020-01-21 LAB — CBC: RBC: 4.58 (ref 3.87–5.11)

## 2020-01-21 LAB — COMPREHENSIVE METABOLIC PANEL
Albumin: 4.3 (ref 3.5–5.0)
Calcium: 9.5 (ref 8.7–10.7)
GFR calc non Af Amer: 56

## 2020-12-22 ENCOUNTER — Ambulatory Visit: Payer: 59 | Admitting: Urology

## 2020-12-29 ENCOUNTER — Ambulatory Visit: Payer: 59 | Admitting: Urology

## 2021-01-05 ENCOUNTER — Ambulatory Visit: Payer: 59 | Admitting: Urology

## 2021-01-19 ENCOUNTER — Other Ambulatory Visit
Admission: RE | Admit: 2021-01-19 | Discharge: 2021-01-19 | Disposition: A | Discharge status: Home / Self Care | Payer: 59 | Attending: Urology | Admitting: Urology

## 2021-01-19 ENCOUNTER — Other Ambulatory Visit: Payer: Self-pay

## 2021-01-19 ENCOUNTER — Ambulatory Visit (INDEPENDENT_AMBULATORY_CARE_PROVIDER_SITE_OTHER): Payer: 59 | Admitting: Urology

## 2021-01-19 VITALS — BP 154/88 | HR 71 | Ht 68.0 in | Wt 178.0 lb

## 2021-01-19 DIAGNOSIS — N400 Enlarged prostate without lower urinary tract symptoms: Secondary | ICD-10-CM | POA: Insufficient documentation

## 2021-01-19 DIAGNOSIS — N50811 Right testicular pain: Secondary | ICD-10-CM

## 2021-01-19 DIAGNOSIS — N138 Other obstructive and reflux uropathy: Secondary | ICD-10-CM

## 2021-01-19 DIAGNOSIS — R972 Elevated prostate specific antigen [PSA]: Secondary | ICD-10-CM | POA: Diagnosis not present

## 2021-01-19 DIAGNOSIS — N401 Enlarged prostate with lower urinary tract symptoms: Secondary | ICD-10-CM | POA: Diagnosis not present

## 2021-01-19 LAB — BLADDER SCAN AMB NON-IMAGING

## 2021-01-19 LAB — PSA: Prostatic Specific Antigen: 4.67 ng/mL — ABNORMAL HIGH (ref 0.00–4.00)

## 2021-01-19 NOTE — Progress Notes (Signed)
01/19/2021 9:05 AM   Brandon Mathews [birth year] [id number]  Referring provider: Ezequiel Kayser, MD [place] [facility] [place],  [place] [address]  Chief Complaint  Patient presents with   Benign Prostatic Hypertrophy    1year follow up    HPI: 65 year old male who returns today for annual follow-up.  He has a history of for right vasitis nodosum and BPH with history of elevated PSA.  Few urinary symptoms, IPSS as below.    Right scrotal pain now stable x years.  Dull, achy, comes and goes.    PSA trend: 11/28/2015: 2.6 12/20/2016: 1.43 01/19/2018: 1.03 01/21/2019: 4.33 12/24/2019: 3.88   IPSS     Row Name 01/19/21 0800         International Prostate Symptom Score   How often have you had the sensation of not emptying your bladder? Less than 1 in 5     How often have you had to urinate less than every two hours? Less than 1 in 5 times     How often have you found you stopped and started again several times when you urinated? Less than 1 in 5 times     How often have you found it difficult to postpone urination? Not at All     How often have you had a weak urinary stream? Less than half the time     How often have you had to strain to start urination? Less than 1 in 5 times     How many times did you typically get up at night to urinate? 1 Time     Total IPSS Score 7           Quality of Life due to urinary symptoms   If you were to spend the rest of your life with your urinary condition just the way it is now how would you feel about that? Mixed              Score:  1-7 Mild 8-19 Moderate 20-35 Severe   PMH: Past Medical History:  Diagnosis Date   Arthritis    BPH (benign prostatic hyperplasia)    Heartburn    Hematuria    HTN (hypertension)    Melanoma ([place])     Surgical History: Past Surgical History:  Procedure Laterality Date   MOHS SURGERY      Home Medications:  Allergies as of 01/19/2021   No Known  Allergies      Medication List        Accurate as of January 19, 2021  9:05 AM. If you have any questions, ask your nurse or doctor.          clotrimazole-betamethasone cream Commonly known as: LOTRISONE   losartan 100 MG tablet Commonly known as: COZAAR Take 100 mg by mouth daily. What changed: Another medication with the same name was removed. Continue taking this medication, and follow the directions you see here. Changed by: Hollice Espy, MD   Multi-Vitamins Tabs Take by mouth. Reported on 11/28/2015   tadalafil 20 MG tablet Commonly known as: CIALIS Take by mouth. Reported on 12/22/2015   triamcinolone ointment 0.1 % Commonly known as: KENALOG        Allergies: No Known Allergies  Family History: Family History  Problem Relation Age of Onset   Breast cancer Sister    Kidney disease Neg Hx    Prostate cancer Neg Hx    Kidney cancer Neg Hx    Bladder  Cancer Neg Hx     Social History:  reports that he has quit smoking. He has never used smokeless tobacco. He reports current alcohol use. He reports that he does not use drugs.   Physical Exam: BP (!) 154/88   Pulse 71   Ht '5\' 8"'$  (1.727 m)   Wt 178 lb (80.7 kg)   BMI 27.06 kg/m   Constitutional:  Alert and oriented, No acute distress. HEENT: [place] AT, moist mucus membranes.  Trachea midline, no masses. Cardiovascular: No clubbing, cyanosis, or edema. Respiratory: Normal respiratory effort, no increased work of breathing. GU: Right vas thickened, stable.  Bilateral descended testicles.   DRE: 50 cc prostate, rubbery, no nodules.   Skin: No rashes, bruises or suspicious lesions. Neurologic: Grossly intact, no focal deficits, moving all 4 extremities. Psychiatric: Normal mood and affect.  Laboratory Data: Lab Results  Component Value Date   CREATININE 1.25 (H) 04/10/2018    Pertinent Imaging: Results for orders placed or performed in visit on 01/19/21  BLADDER SCAN AMB NON-IMAGING  Result Value  Ref Range   Scan Result 25m     Assessment & Plan:    1. BPH without urinary obstruction Minimal urinary symptoms  We will continue to monitor, emptying well - BLADDER SCAN AMB NON-IMAGING - PSA; Future - PSA; Future  2. Elevated PSA Personal history of elevated PSA, rectal exam updated today  PSA drawn today, will call with results - PSA; Future - PSA; Future  3. Right testicular pain Secondary to probable chronic right vasitis nodosum  Exam today stable  In the past, he was interchanging excision of his epididymis and vas for chronic pain issues.  We will address this again today.  At this point, given that his discomfort is minimal, would recommend holding off.  He will let [place] if he changes his mind or start to have more issues.  Follow-up in a year with IPSS/PVR/PSA/DRE   AHollice Espy MD  [address] [place] [phone] I spent 35 total minutes on the day of the encounter including pre-visit review of the medical record, face-to-face time with the patient, and post visit ordering of labs/imaging/tests.

## 2021-01-22 ENCOUNTER — Telehealth: Payer: Self-pay | Admitting: *Deleted

## 2021-01-22 DIAGNOSIS — R972 Elevated prostate specific antigen [PSA]: Secondary | ICD-10-CM

## 2021-01-22 NOTE — Telephone Encounter (Addendum)
Patient informed, voiced understanding. Scheduled follow up in person for [place].   ----- Message from Hollice Espy, MD sent at 01/21/2021  9:57 AM EDT ----- PSA is actually up again compared to a year ago.  Lets recheck in 3 months and if it still elevated, would recommend either prostate biopsy or prostate MRI.  Please arrange a follow-up with me in 3 months after this PSA to discuss.  This can be virtual if desired.  Hollice Espy, MD

## 2021-05-11 ENCOUNTER — Ambulatory Visit: Payer: 59 | Admitting: Urology

## 2021-05-17 NOTE — Progress Notes (Signed)
05/18/21 8:45 AM   Brandon Mathews 1956-02-28 [id number]  Referring provider:  No referring provider defined for this encounter. Chief Complaint  Patient presents with   Benign Prostatic Hypertrophy     HPI: Brandon Mathews is a 65 y.o.male with a personal history of right vasitis nodosum, BPH, and a history of elevated PSA, who returns today for 3 month follow-up and PSA recheck.   His most recent PSA as of 01/19/2021 was 4.67.   Recommended PSA recheck and f/u after but PSA not done.  Pending today.  Rectal exam 01/2021 unremarkable.  No change in urinary symptoms.     PSA trend:   Component Prostatic Specific Antigen  Latest Ref Rng & Units 0.00 - 4.00 ng/mL  11/28/2015 2.6  12/20/2016 1.43  01/19/2018 1.03  01/21/2019 4.33  12/24/2019 3.88  01/19/2021 4.67 (H)    12/24/2019 3.88        PMH: Past Medical History:  Diagnosis Date   Arthritis    BPH (benign prostatic hyperplasia)    Heartburn    Hematuria    HTN (hypertension)    Melanoma ([place])     Surgical History: Past Surgical History:  Procedure Laterality Date   MOHS SURGERY      Home Medications:  Allergies as of 05/18/2021   No Known Allergies      Medication List        Accurate as of May 18, 2021  8:45 AM. If you have any questions, ask your nurse or doctor.          clotrimazole-betamethasone cream Commonly known as: LOTRISONE   losartan 100 MG tablet Commonly known as: COZAAR Take 100 mg by mouth daily.   Multi-Vitamins Tabs Take by mouth. Reported on 11/28/2015   tadalafil 20 MG tablet Commonly known as: CIALIS Take by mouth. Reported on 12/22/2015   triamcinolone ointment 0.1 % Commonly known as: KENALOG        Allergies: No Known Allergies  Family History: Family History  Problem Relation Age of Onset   Breast cancer Sister    Kidney disease Neg Hx    Prostate cancer Neg Hx    Kidney cancer Neg Hx    Bladder Cancer Neg Hx     Social History:   reports that he has quit smoking. He has never used smokeless tobacco. He reports current alcohol use. He reports that he does not use drugs.   Physical Exam: BP (!) 170/107    Pulse 78    Ht 5\' 8"  (1.727 m)    Wt 172 lb (78 kg)    BMI 26.15 kg/m   Constitutional:  Alert and oriented, No acute distress. HEENT: [place] AT, moist mucus membranes.  Trachea midline, no masses. Cardiovascular: No clubbing, cyanosis, or edema. Respiratory: Normal respiratory effort, no increased work of breathing. Skin: No rashes, bruises or suspicious lesions. Neurologic: Grossly intact, no focal deficits, moving all 4 extremities. Psychiatric: Normal mood and affect.  Laboratory Data:  Lab Results  Component Value Date   CREATININE 1.25 (H) 04/10/2018    Assessment & Plan:    1. Elevated PSA Will call with repeat PSA and recommendations  We discussed today that if his PSA remains elevated, could consider prostate MRI versus biopsy.  He is leaning towards MRI.   Conley Rolls as a scribe for Hollice Espy, MD.,have documented all relevant documentation on the behalf of Hollice Espy, MD,as directed by  Hollice Espy, MD while in the presence of [place]  Erlene Quan, MD.  [facility] [address], [place], [place] [address] [phone]

## 2021-05-18 ENCOUNTER — Ambulatory Visit (INDEPENDENT_AMBULATORY_CARE_PROVIDER_SITE_OTHER): Payer: 59 | Admitting: Urology

## 2021-05-18 ENCOUNTER — Ambulatory Visit: Payer: 59 | Admitting: Internal Medicine

## 2021-05-18 ENCOUNTER — Other Ambulatory Visit
Admission: RE | Admit: 2021-05-18 | Discharge: 2021-05-18 | Disposition: A | Discharge status: Home / Self Care | Payer: 59 | Attending: Urology | Admitting: Urology

## 2021-05-18 ENCOUNTER — Other Ambulatory Visit: Payer: Self-pay

## 2021-05-18 VITALS — BP 170/107 | HR 78 | Ht 68.0 in | Wt 172.0 lb

## 2021-05-18 DIAGNOSIS — R972 Elevated prostate specific antigen [PSA]: Secondary | ICD-10-CM

## 2021-05-18 LAB — PSA: Prostatic Specific Antigen: 5.31 ng/mL — ABNORMAL HIGH (ref 0.00–4.00)

## 2021-05-21 ENCOUNTER — Telehealth: Payer: Self-pay

## 2021-05-21 ENCOUNTER — Other Ambulatory Visit: Payer: Self-pay

## 2021-05-21 ENCOUNTER — Encounter: Payer: Self-pay | Admitting: Internal Medicine

## 2021-05-21 ENCOUNTER — Ambulatory Visit (INDEPENDENT_AMBULATORY_CARE_PROVIDER_SITE_OTHER): Payer: 59 | Admitting: Internal Medicine

## 2021-05-21 VITALS — BP 130/84 | HR 89 | Ht 68.0 in | Wt 178.0 lb

## 2021-05-21 DIAGNOSIS — N529 Male erectile dysfunction, unspecified: Secondary | ICD-10-CM | POA: Diagnosis not present

## 2021-05-21 DIAGNOSIS — R972 Elevated prostate specific antigen [PSA]: Secondary | ICD-10-CM | POA: Diagnosis not present

## 2021-05-21 DIAGNOSIS — I10 Essential (primary) hypertension: Secondary | ICD-10-CM

## 2021-05-21 MED ORDER — TADALAFIL 20 MG PO TABS: 20.0000 mg | ORAL_TABLET | Freq: Every day | ORAL | 5 refills | Status: DC | PRN

## 2021-05-21 NOTE — Progress Notes (Signed)
Date:  05/21/2021   Name:  Brandon Mathews   DOB:  Dec 17, 1955   MRN:  [id number]   Chief Complaint: New Patient (Initial Visit), Hypertension, and Erectile Dysfunction  Hypertension This is a chronic problem. The current episode started more than 1 year ago. The problem has been waxing and waning since onset. The problem is resistant. Pertinent negatives include no chest pain, headaches, palpitations or shortness of breath. Past treatments include angiotensin blockers. There is no history of kidney disease, CAD/MI or CVA.   Elevated PSA - rising recently.  Urology recommends biopsy.  He is somewhat anxious about the possibility of the biopsy.  Lab Results  Component Value Date   NA 139 01/21/2020   K 4.4 01/21/2020   CO2 31 (A) 01/21/2020   BUN 20 01/21/2020   CREATININE 1.3 01/21/2020   CALCIUM 9.5 01/21/2020   GFRNONAA 56 01/21/2020   No results found for: CHOL, HDL, LDLCALC, LDLDIRECT, TRIG, CHOLHDL No results found for: TSH No results found for: HGBA1C Lab Results  Component Value Date   WBC 6.4 01/21/2020   HGB 14.6 01/21/2020   HCT 43 01/21/2020   PLT 214 01/21/2020   No results found for: ALT, AST, GGT, ALKPHOS, BILITOT No results found for: 25OHVITD2, 25OHVITD3, VD25OH   Review of Systems  Constitutional:  Negative for chills, fatigue and fever.  HENT:  Negative for trouble swallowing.   Respiratory:  Negative for chest tightness and shortness of breath.   Cardiovascular:  Negative for chest pain and palpitations.  Gastrointestinal:  Negative for constipation and diarrhea.  Genitourinary:  Negative for frequency, hematuria and urgency.  Neurological:  Negative for dizziness, light-headedness and headaches.  Psychiatric/Behavioral:  The patient is nervous/anxious (about prostate issues).    Patient Active Problem List   Diagnosis Date Noted   Chronic renal insufficiency, stage 3 (moderate) ([place]) 04/25/2019   Left renal mass 01/02/2017   ED (erectile  dysfunction) of organic origin 12/22/2015   Acid reflux 12/22/2015   Adaptive colitis 12/22/2015   CA of skin 12/22/2015   BPH with obstruction/lower urinary tract symptoms 12/01/2015   Arthralgia of hip 09/06/2015   Other long term (current) drug therapy 12/29/2014   Degeneration of intervertebral disc of lumbar region 12/23/2013   Can't get food down 12/23/2013   Benign essential HTN 12/23/2013   Hypercholesterolemia 12/23/2013   H/O Malignant melanoma 07/04/2012    No Known Allergies  Past Surgical History:  Procedure Laterality Date   MOHS SURGERY      Social History   Tobacco Use   Smoking status: Former   Smokeless tobacco: Never   Tobacco comments:    quit 15 years  Substance Use Topics   Alcohol use: Yes    Alcohol/week: 0.0 standard drinks    Comment: social   Drug use: No     Medication list has been reviewed and updated.  Current Meds  Medication Sig   clotrimazole-betamethasone (LOTRISONE) cream    losartan (COZAAR) 100 MG tablet Take 100 mg by mouth daily.   Multiple Vitamin (MULTI-VITAMINS) TABS Take by mouth. Reported on 11/28/2015   triamcinolone ointment (KENALOG) 0.1 %    [DISCONTINUED] tadalafil (CIALIS) 20 MG tablet Take by mouth. Reported on 12/22/2015    PHQ 2/9 Scores 05/21/2021  PHQ - 2 Score 0  PHQ- 9 Score 2    GAD 7 : Generalized Anxiety Score 05/21/2021  Nervous, Anxious, on Edge 0  Control/stop worrying 1  Worry too much - different  things 0  Trouble relaxing 1  Restless 0  Easily annoyed or irritable 0  Afraid - awful might happen 0  Total GAD 7 Score 2  Anxiety Difficulty Not difficult at all    BP Readings from Last 3 Encounters:  05/21/21 130/84  05/18/21 (!) 170/107  01/19/21 (!) 154/88    Physical Exam Vitals and nursing note reviewed.  Constitutional:      General: He is not in acute distress.    Appearance: Normal appearance. He is well-developed.  HENT:     Head: Normocephalic and atraumatic.  Neck:      Vascular: No carotid bruit.  Cardiovascular:     Rate and Rhythm: Normal rate and regular rhythm.     Pulses: Normal pulses.     Heart sounds: No murmur heard. Pulmonary:     Effort: Pulmonary effort is normal. No respiratory distress.     Breath sounds: No wheezing or rhonchi.  Musculoskeletal:     Cervical back: Normal range of motion.     Right lower leg: No edema.     Left lower leg: No edema.  Lymphadenopathy:     Cervical: No cervical adenopathy.  Skin:    General: Skin is warm and dry.     Findings: No rash.  Neurological:     Mental Status: He is alert and oriented to person, place, and time.  Psychiatric:        Mood and Affect: Mood normal.        Behavior: Behavior normal.    Wt Readings from Last 3 Encounters:  05/21/21 178 lb (80.7 kg)  05/18/21 172 lb (78 kg)  01/19/21 178 lb (80.7 kg)    BP 130/84 (BP Location: Right Arm, Cuff Size: Normal)    Pulse 89    Ht 5\' 8"  (1.727 m)    Wt 178 lb (80.7 kg) Comment: Weighed with Boots on Today   SpO2 96%    BMI 27.06 kg/m   Assessment and Plan: 1. Benign essential HTN BP fairly well controlled - some higher readings at home recently due to worry over prostate issues. Will continue losartan 100 mg per day.  Monitor BP and call if persistently elevated to add medication - likely amlodipine. - CBC with Differential/Platelet - Comprehensive metabolic panel  2. Erectile dysfunction, unspecified erectile dysfunction type Cialis working well - needs refills - tadalafil (CIALIS) 20 MG tablet; Take 1 tablet (20 mg total) by mouth daily as needed for erectile dysfunction. Reported on 12/22/2015  Dispense: 10 tablet; Refill: 5  3. Increased prostate specific antigen (PSA) velocity Urology recommends bx vs MRI with biopsy if indicated. He is unsure which direction to go but is very anxious about the biopsy.   Partially dictated using Editor, commissioning. Any errors are unintentional.  Halina Maidens, MD [facility]  05/21/2021

## 2021-05-21 NOTE — Telephone Encounter (Signed)
Left message for pt to return my call.

## 2021-05-21 NOTE — Telephone Encounter (Signed)
-----   Message from Hollice Espy, MD sent at 05/21/2021 12:41 PM EST ----- I am not comfortable with this rising PSA.  It makes me concerned that there is some underlying prostate cancer.  Options include going ahead and proceed with prostate biopsy in the office versus prostate MRI which would give [place] a better picture and sense of what is going on the prostate.  If the prostate MRI shows a lesion however this would ultimately lead to a biopsy.  Either way is a very reasonable approach.  Please let us know if he has a preference on how would like to proceed or if he like to have a virtual visit to discuss further.  Hollice Espy, MD

## 2021-05-22 LAB — COMPREHENSIVE METABOLIC PANEL
ALT: 30 IU/L (ref 0–44)
AST: 23 IU/L (ref 0–40)
Albumin/Globulin Ratio: 1.7 (ref 1.2–2.2)
Albumin: 4.4 g/dL (ref 3.8–4.8)
Alkaline Phosphatase: 65 IU/L (ref 44–121)
BUN/Creatinine Ratio: 22 (ref 10–24)
BUN: 29 mg/dL — ABNORMAL HIGH (ref 8–27)
Bilirubin Total: 0.5 mg/dL (ref 0.0–1.2)
CO2: 24 mmol/L (ref 20–29)
Calcium: 9.4 mg/dL (ref 8.6–10.2)
Chloride: 102 mmol/L (ref 96–106)
Creatinine, Ser: 1.31 mg/dL — ABNORMAL HIGH (ref 0.76–1.27)
Globulin, Total: 2.6 g/dL (ref 1.5–4.5)
Glucose: 95 mg/dL (ref 70–99)
Potassium: 4.2 mmol/L (ref 3.5–5.2)
Sodium: 138 mmol/L (ref 134–144)
Total Protein: 7 g/dL (ref 6.0–8.5)
eGFR: 61 mL/min/{1.73_m2} (ref >59)

## 2021-05-22 LAB — CBC WITH DIFFERENTIAL/PLATELET
Basophils Absolute: 0.1 10*3/uL (ref 0.0–0.2)
Basos: 1 %
EOS (ABSOLUTE): 0.2 10*3/uL (ref 0.0–0.4)
Eos: 2 %
Hematocrit: 44.1 % (ref 37.5–51.0)
Hemoglobin: 14.8 g/dL (ref 13.0–17.7)
Immature Grans (Abs): 0 10*3/uL (ref 0.0–0.1)
Immature Granulocytes: 0 %
Lymphocytes Absolute: 2.9 10*3/uL (ref 0.7–3.1)
Lymphs: 34 %
MCH: 30.8 pg (ref 26.6–33.0)
MCHC: 33.6 g/dL (ref 31.5–35.7)
MCV: 92 fL (ref 79–97)
Monocytes Absolute: 0.6 10*3/uL (ref 0.1–0.9)
Monocytes: 7 %
Neutrophils Absolute: 4.7 10*3/uL (ref 1.4–7.0)
Neutrophils: 56 %
Platelets: 269 10*3/uL (ref 150–450)
RBC: 4.8 x10E6/uL (ref 4.14–5.80)
RDW: 12.1 % (ref 11.6–15.4)
WBC: 8.5 10*3/uL (ref 3.4–10.8)

## 2021-05-23 NOTE — Telephone Encounter (Signed)
Left VM to return call 

## 2021-05-24 NOTE — Telephone Encounter (Signed)
I spoke with patient, he would like to call his insurance to see which is more cost effective. He stated he will contact the clinic after the new year. I expressed the concern of not waiting. He verbalized understanding.

## 2021-06-29 ENCOUNTER — Encounter: Payer: Self-pay | Admitting: *Deleted

## 2021-07-03 ENCOUNTER — Other Ambulatory Visit: Payer: Self-pay | Admitting: Internal Medicine

## 2021-07-03 NOTE — Telephone Encounter (Signed)
° °  Requested medication (s) are on the active medication list: by historical provider   Future visit scheduled: 09/21/21  Notes to clinic:  Historical Provider, please assess.   Requested Prescriptions  Pending Prescriptions Disp Refills   losartan (COZAAR) 100 MG tablet [Pharmacy Med Name: LOSARTAN POTASSIUM 100 MG TAB] 90 tablet     Sig: TAKE 1 TABLET (100 MG TOTAL) BY MOUTH ONCE DAILY FOR BLOOD PRESSURE     Cardiovascular:  Angiotensin Receptor Blockers Failed - 07/03/2021  5:35 PM      Failed - Cr in normal range and within 180 days    Creatinine, Ser  Date Value Ref Range Status  05/21/2021 1.31 (H) 0.76 - 1.27 mg/dL Final          Passed - K in normal range and within 180 days    Potassium  Date Value Ref Range Status  05/21/2021 4.2 3.5 - 5.2 mmol/L Final          Passed - Patient is not pregnant      Passed - Last BP in normal range    BP Readings from Last 1 Encounters:  05/21/21 130/84          Passed - Valid encounter within last 6 months    Recent Outpatient Visits           1 month ago Benign essential HTN   [facility] Glean Hess, MD       Future Appointments             In 2 months Army Melia Jesse Sans, MD [facility], [facility]

## 2021-07-23 NOTE — Progress Notes (Incomplete)
° °  07/23/21  CC: No chief complaint on file.     HPI:  Brandon Mathews is a 66 y.o. male with a personal history of right vasitis nodosum, BPH, and a history of elevated PSA, who returns today for a prostate biopsy.   His most recent PSA was 5.31 on 05/18/2021 with a previous PSA of 4.67 on 01/19/2021 .         There were no vitals filed for this visit. NED. A&Ox3.   No respiratory distress   Abd soft, NT, ND Normal external genitalia with patent urethral meatus  Prostate Biopsy Procedure   Informed consent was obtained after discussing risks/benefits of the procedure.  A time out was performed to ensure correct patient identity.  Pre-Procedure: - Last PSA Level:  Lab Results  Component Value Date   PSA 4.33 01/21/2020   - Gentamicin given prophylactically - Levaquin 500 mg administered PO -Transrectal Ultrasound performed revealing a *** gm prostate -No significant hypoechoic or median lobe noted  Procedure: - Prostate block performed using 10 cc 1% lidocaine and biopsies taken from sextant areas, a total of 12 under ultrasound guidance.  Post-Procedure: - Patient tolerated the procedure well - He was counseled to seek immediate medical attention if experiences any severe pain, significant bleeding, or fevers - Return in one week to discuss biopsy results  I,Kailey Littlejohn,acting as a scribe for Hollice Espy, MD.,have documented all relevant documentation on the behalf of Hollice Espy, MD,as directed by  Hollice Espy, MD while in the presence of Hollice Espy, MD.

## 2021-07-24 ENCOUNTER — Other Ambulatory Visit: Payer: 59 | Admitting: Urology

## 2021-07-31 ENCOUNTER — Ambulatory Visit: Payer: 59 | Admitting: Urology

## 2021-08-01 NOTE — Progress Notes (Signed)
? ?  08/02/21 ? ?CC:  ?Chief Complaint  ?Patient presents with  ? Prostate Biopsy  ? ?HPI: ? ?Brandon Mathews is a 66 y.o. male with a personal history of right vasitis nodosum, BPH, and a history of elevated PSA, who presents today for a prostate biopsy.  ? ?His most recent PSA was 5.31 on 05/18/2021 with a previous PSA of 4.67. Which was concerning he was given options to proceed with MRI versus biopsy and he elected for biopsy.  ? ? ?Vitals:  ? 08/02/21 0930  ?BP: (!) 179/106  ?Pulse: 77  ?NED. A&Ox3.   ?No respiratory distress   ?Abd soft, NT, ND ?Normal external genitalia with patent urethral meatus ? ?Prostate Biopsy Procedure  ? ?Informed consent was obtained after discussing risks/benefits of the procedure.  A time out was performed to ensure correct patient identity. ? ?Pre-Procedure: ?- Last PSA Level:  ?Component ?    Latest Ref Rng & Units 05/18/2021  ?Prostatic Specific Antigen ?    0.00 - 4.00 ng/mL 5.31 (H)  ? ?- Gentamicin given prophylactically ?- Levaquin 500 mg administered PO ?-Transrectal Ultrasound performed revealing a  121.4 gm prostate ?-No significant hypoechoic or median lobe noted ? ?Procedure: ?- Prostate block performed using 10 cc 1% lidocaine and biopsies taken from sextant areas, a total of 12 under ultrasound guidance. ? ?Post-Procedure: ?- Patient tolerated the procedure well ?- He was counseled to seek immediate medical attention if experiences any severe pain, significant bleeding, or fevers ?- Return in one week to discuss biopsy results ? ?

## 2021-08-02 ENCOUNTER — Ambulatory Visit (INDEPENDENT_AMBULATORY_CARE_PROVIDER_SITE_OTHER): Payer: 59 | Admitting: Urology

## 2021-08-02 ENCOUNTER — Other Ambulatory Visit: Payer: Self-pay

## 2021-08-02 VITALS — BP 179/106 | HR 77 | Ht 68.0 in | Wt 170.0 lb

## 2021-08-02 DIAGNOSIS — R972 Elevated prostate specific antigen [PSA]: Secondary | ICD-10-CM | POA: Diagnosis not present

## 2021-08-02 MED ORDER — GENTAMICIN SULFATE 40 MG/ML IJ SOLN
80.0000 mg | Freq: Once | INTRAMUSCULAR | Status: AC
  Administered 2021-08-02: 80 mg via INTRAMUSCULAR

## 2021-08-02 MED ORDER — LEVOFLOXACIN 500 MG PO TABS
500.0000 mg | ORAL_TABLET | Freq: Once | ORAL | Status: AC
  Administered 2021-08-02: 500 mg via ORAL

## 2021-08-06 ENCOUNTER — Telehealth: Payer: Self-pay

## 2021-08-06 LAB — SURGICAL PATHOLOGY

## 2021-08-06 NOTE — Telephone Encounter (Signed)
Lm for pt to return my call.

## 2021-08-06 NOTE — Telephone Encounter (Signed)
-----   Message from Hollice Espy, MD sent at 08/06/2021  2:41 PM EST ----- ?Please let this patient know that his biopsy was negative for malignancy.  This is awesome news.  I will leave it up to him, can see him in 6 months with another PSA prior or I am happy to see him to discuss management of his massively enlarged prostate.  If he is not having significant urinary symptoms, then we will see him in 6 months. ? ?Hollice Espy, MD ? ?

## 2021-08-07 NOTE — Telephone Encounter (Signed)
Pt wants to keep appointment.  ?

## 2021-08-08 NOTE — Progress Notes (Signed)
? ?08/09/21 ?9:40 AM  ? ?Brandon Mathews ?06/22/1955 ?[id number] ? ?Referring provider:  ?Glean Hess, MD ?[address] ?[address] ?[place],  [place] [address] ?Chief Complaint  ?Patient presents with  ? Follow-up  ? ? ? ?HPI: ?Brandon Mathews is a 66 y.o.male  with a personal history of right vasitis nodosum, BPH, and a history of elevated PSA, who presents today for a prostate biopsy results.  ? ?His most recent PSA was 5.31 on 05/18/2021 with a previous PSA of 4.67. ? ?He underwent a prostate biopsy on 08/02/2021. TRUS was 121.4 Surgical pathology was negative for malignancy.   ? ?He has very minimal symptoms.  Patient gets up once at night. ? ?PMH: ?Past Medical History:  ?Diagnosis Date  ? Arthritis   ? BPH (benign prostatic hyperplasia)   ? Heartburn   ? Hematuria   ? HTN (hypertension)   ? Melanoma ([place])   ? ? ?Surgical History: ?Past Surgical History:  ?Procedure Laterality Date  ? MOHS SURGERY    ? ? ?Home Medications:  ?Allergies as of 08/09/2021   ?No Known Allergies ?  ? ?  ?Medication List  ?  ? ?  ? Accurate as of August 09, 2021  9:40 AM. If you have any questions, ask your nurse or doctor.  ?  ?  ? ?  ? ?clotrimazole-betamethasone cream ?Commonly known as: LOTRISONE ?  ?losartan 100 MG tablet ?Commonly known as: COZAAR ?Take 1 tablet (100 mg total) by mouth daily. ?  ?Multi-Vitamins Tabs ?Take by mouth. Reported on 11/28/2015 ?  ?tadalafil 20 MG tablet ?Commonly known as: CIALIS ?Take 1 tablet (20 mg total) by mouth daily as needed for erectile dysfunction. Reported on 12/22/2015 ?  ?triamcinolone ointment 0.1 % ?Commonly known as: KENALOG ?  ? ?  ? ? ?Allergies: No Known Allergies ? ?Family History: ?Family History  ?Problem Relation Age of Onset  ? Breast cancer Sister   ? Kidney disease Neg Hx   ? Prostate cancer Neg Hx   ? Kidney cancer Neg Hx   ? Bladder Cancer Neg Hx   ? ? ?Social History:  reports that he has quit smoking. He has never used smokeless tobacco. He reports current alcohol use.  He reports that he does not use drugs. ? ? ?Physical Exam: ?BP (!) 164/94   Pulse 78   Ht '5\' 8"'$  (1.727 m)   Wt 168 lb (76.2 kg)   BMI 25.54 kg/m?   ?Constitutional:  Alert and oriented, No acute distress. ?HEENT: [place] AT, moist mucus membranes.  Trachea midline, no masses. ?Cardiovascular: No clubbing, cyanosis, or edema. ?Respiratory: Normal respiratory effort, no increased work of breathing. ?Skin: No rashes, bruises or suspicious lesions. ?Neurologic: Grossly intact, no focal deficits, moving all 4 extremities. ?Psychiatric: Normal mood and affect. ? ?Laboratory Data: ?Lab Results  ?Component Value Date  ? CREATININE 1.31 (H) 05/21/2021  ? ?Lab Results  ?Component Value Date  ? PSA 4.33 01/21/2020  ? ?Assessment & Plan:   ?Elevated PSA  ?- Underwent prostate biopsy that was negative for malignancy ?- Likely secondary to enlarged prostate, volume greater than 100 cc ?- Will monitor in 6 months with PSA  ?-Otherwise minimally symptomatic from prostamegaly ? ?Return in 6 months for IPSS and PSA prior ? ?I,Kailey Littlejohn,acting as a scribe for Hollice Espy, MD.,have documented all relevant documentation on the behalf of Hollice Espy, MD,as directed by  Hollice Espy, MD while in the presence of Hollice Espy, MD. ? ? ?  I have reviewed the above documentation for accuracy and completeness, and I agree with the above.  ? ?Hollice Espy, MD ? ?[facility] ?[address] ?[place],  [address] ?[phone] ? ?

## 2021-08-09 ENCOUNTER — Ambulatory Visit: Payer: 59 | Admitting: Urology

## 2021-08-09 ENCOUNTER — Ambulatory Visit (INDEPENDENT_AMBULATORY_CARE_PROVIDER_SITE_OTHER): Payer: 59 | Admitting: Urology

## 2021-08-09 ENCOUNTER — Other Ambulatory Visit: Payer: Self-pay

## 2021-08-09 ENCOUNTER — Encounter: Payer: Self-pay | Admitting: Urology

## 2021-08-09 VITALS — BP 164/94 | HR 78 | Ht 68.0 in | Wt 168.0 lb

## 2021-08-09 DIAGNOSIS — N400 Enlarged prostate without lower urinary tract symptoms: Secondary | ICD-10-CM | POA: Diagnosis not present

## 2021-08-09 DIAGNOSIS — R972 Elevated prostate specific antigen [PSA]: Secondary | ICD-10-CM | POA: Diagnosis not present

## 2021-08-10 ENCOUNTER — Ambulatory Visit: Payer: 59 | Admitting: Urology

## 2021-09-21 ENCOUNTER — Encounter: Payer: Self-pay | Admitting: Internal Medicine

## 2021-09-21 ENCOUNTER — Ambulatory Visit (INDEPENDENT_AMBULATORY_CARE_PROVIDER_SITE_OTHER): Payer: 59 | Admitting: Internal Medicine

## 2021-09-21 VITALS — BP 126/82 | HR 65 | Ht 68.0 in | Wt 168.0 lb

## 2021-09-21 DIAGNOSIS — Z1159 Encounter for screening for other viral diseases: Secondary | ICD-10-CM | POA: Diagnosis not present

## 2021-09-21 DIAGNOSIS — N401 Enlarged prostate with lower urinary tract symptoms: Secondary | ICD-10-CM

## 2021-09-21 DIAGNOSIS — Z0000 Encounter for general adult medical examination without abnormal findings: Secondary | ICD-10-CM

## 2021-09-21 DIAGNOSIS — E7800 Pure hypercholesterolemia, unspecified: Secondary | ICD-10-CM | POA: Diagnosis not present

## 2021-09-21 DIAGNOSIS — K219 Gastro-esophageal reflux disease without esophagitis: Secondary | ICD-10-CM

## 2021-09-21 DIAGNOSIS — N183 Chronic kidney disease, stage 3 unspecified: Secondary | ICD-10-CM

## 2021-09-21 DIAGNOSIS — I10 Essential (primary) hypertension: Secondary | ICD-10-CM | POA: Diagnosis not present

## 2021-09-21 DIAGNOSIS — N138 Other obstructive and reflux uropathy: Secondary | ICD-10-CM

## 2021-09-21 NOTE — Progress Notes (Signed)
? ? ?Date:  09/21/2021  ? ?Name:  Brandon Mathews   DOB:  Nov 26, 1955   MRN:  [id number] ? ? ?Chief Complaint: Annual Exam ?Brandon Mathews is a 66 y.o. male who presents today for his Complete Annual Exam. He feels well. He reports exercising none. He reports he is sleeping well.  ? ?Colonoscopy: 05/2018 ? ?Immunization History  ?Administered Date(s) Administered  ? Influenza,inj,Quad PF,6+ Mos 04/26/2019  ? Influenza-Unspecified 03/01/2021  ? PFIZER Comirnaty(Gray Top)Covid-19 Tri-Sucrose Vaccine 08/20/2019, 09/09/2019  ? PFIZER(Purple Top)SARS-COV-2 Vaccination 05/18/2020  ? Pension scheme manager 33yr & up 03/30/2021  ? Tdap 04/20/2009, 01/21/2019  ? Zoster Recombinat (Shingrix) 01/19/2018, 07/06/2018  ? ?Health Maintenance Due  ?Topic Date Due  ? Hepatitis C Screening  Never done  ?  ?Lab Results  ?Component Value Date  ? PSA1 2.6 11/28/2015  ? PSA 4.33 01/21/2020  ? ? ?Hypertension ?This is a chronic problem. The problem is controlled. Pertinent negatives include no chest pain, headaches, palpitations or shortness of breath. Past treatments include angiotensin blockers. There are no compliance problems (occasionally his readings are high).  Hypertensive end-organ damage includes kidney disease. There is no history of CAD/MI or CVA.  ?Benign Prostatic Hypertrophy ?This is a chronic problem. The problem is unchanged. Pertinent negatives include no dysuria or hematuria. Treatments tried: cialis - recent bx negative for malignancy.  ?Gastroesophageal Reflux ?He complains of heartburn. He reports no abdominal pain, no chest pain, no coughing or no wheezing. This is a recurrent problem. The problem occurs occasionally. Nothing aggravates the symptoms. Pertinent negatives include no fatigue. He has tried an antacid for the symptoms. The treatment provided significant relief.  ? ?Lab Results  ?Component Value Date  ? NA 138 05/21/2021  ? K 4.2 05/21/2021  ? CO2 24 05/21/2021  ? GLUCOSE 95  05/21/2021  ? BUN 29 (H) 05/21/2021  ? CREATININE 1.31 (H) 05/21/2021  ? CALCIUM 9.4 05/21/2021  ? EGFR 61 05/21/2021  ? GFRNONAA 56 01/21/2020  ? ?No results found for: CHOL, HDL, LDLCALC, LDLDIRECT, TRIG, CHOLHDL ?No results found for: TSH ?No results found for: HGBA1C ?Lab Results  ?Component Value Date  ? WBC 8.5 05/21/2021  ? HGB 14.8 05/21/2021  ? HCT 44.1 05/21/2021  ? MCV 92 05/21/2021  ? PLT 269 05/21/2021  ? ?Lab Results  ?Component Value Date  ? ALT 30 05/21/2021  ? AST 23 05/21/2021  ? ALKPHOS 65 05/21/2021  ? BILITOT 0.5 05/21/2021  ? ?No results found for: 25OHVITD2, 2[place] VD25OH  ? ?Review of Systems  ?Constitutional:  Negative for fatigue and unexpected weight change.  ?HENT:  Negative for hearing loss, nosebleeds and trouble swallowing.   ?Eyes:  Negative for visual disturbance.  ?Respiratory:  Negative for cough, chest tightness, shortness of breath and wheezing.   ?Cardiovascular:  Negative for chest pain, palpitations and leg swelling.  ?Gastrointestinal:  Positive for heartburn. Negative for abdominal pain, constipation and diarrhea.  ?Genitourinary:  Negative for dysuria and hematuria.  ?Musculoskeletal:  Positive for arthralgias (knee and back).  ?Skin:  Positive for rash.  ?Neurological:  Negative for dizziness, weakness, light-headedness and headaches.  ?Hematological:  Negative for adenopathy.  ?Psychiatric/Behavioral:  Negative for dysphoric mood and sleep disturbance. The patient is not nervous/anxious.   ? ?Patient Active Problem List  ? Diagnosis Date Noted  ? Chronic renal insufficiency, stage 3 (moderate) ([place] 04/25/2019  ? Left renal mass 01/02/2017  ? ED (erectile dysfunction) of organic origin 12/22/2015  ? Acid reflux  12/22/2015  ? Adaptive colitis 12/22/2015  ? CA of skin 12/22/2015  ? BPH with obstruction/lower urinary tract symptoms 12/01/2015  ? Arthralgia of hip 09/06/2015  ? Other long term (current) drug therapy 12/29/2014  ? Degeneration of intervertebral disc of  lumbar region 12/23/2013  ? Can't get food down 12/23/2013  ? Benign essential HTN 12/23/2013  ? Hypercholesterolemia 12/23/2013  ? H/O Malignant melanoma 07/04/2012  ? ? ?No Known Allergies ? ?Past Surgical History:  ?Procedure Laterality Date  ? MOHS SURGERY    ? ? ?Social History  ? ?Tobacco Use  ? Smoking status: Former  ? Smokeless tobacco: Never  ? Tobacco comments:  ?  smoked for 25 years but has quit for 15 years   ?Substance Use Topics  ? Alcohol use: Yes  ?  Alcohol/week: 0.0 standard drinks  ?  Comment: social  ? Drug use: No  ? ? ? ?Medication list has been reviewed and updated. ? ?Current Meds  ?Medication Sig  ? clotrimazole-betamethasone (LOTRISONE) cream   ? losartan (COZAAR) 100 MG tablet Take 1 tablet (100 mg total) by mouth daily.  ? Multiple Vitamin (MULTI-VITAMINS) TABS Take by mouth. Reported on 11/28/2015  ? tadalafil (CIALIS) 20 MG tablet Take 1 tablet (20 mg total) by mouth daily as needed for erectile dysfunction. Reported on 12/22/2015  ? triamcinolone ointment (KENALOG) 0.1 %   ? ? ? ?  09/21/2021  ?  7:59 AM 05/21/2021  ?  3:09 PM  ?GAD 7 : Generalized Anxiety Score  ?Nervous, Anxious, on Edge 0 0  ?Control/stop worrying 0 1  ?Worry too much - different things 0 0  ?Trouble relaxing 0 1  ?Restless 0 0  ?Easily annoyed or irritable 0 0  ?Afraid - awful might happen 0 0  ?Total GAD 7 Score 0 2  ?Anxiety Difficulty Not difficult at all Not difficult at all  ? ? ? ?  09/21/2021  ?  7:59 AM  ?Depression screen PHQ 2/9  ?Decreased Interest 0  ?Down, Depressed, Hopeless 0  ?PHQ - 2 Score 0  ?Altered sleeping 0  ?Tired, decreased energy 0  ?Change in appetite 0  ?Feeling bad or failure about yourself  0  ?Trouble concentrating 0  ?Moving slowly or fidgety/restless 0  ?Suicidal thoughts 0  ?PHQ-9 Score 0  ?Difficult doing work/chores Not difficult at all  ? ? ?BP Readings from Last 3 Encounters:  ?09/21/21 126/82  ?08/09/21 (!) 164/94  ?08/02/21 (!) 179/106  ? ? ?Physical Exam ?Vitals and nursing  note reviewed.  ?Constitutional:   ?   General: He is not in acute distress. ?   Appearance: Normal appearance. He is well-developed.  ?HENT:  ?   Head: Normocephalic and atraumatic.  ?   Right Ear: Tympanic membrane, ear canal and external ear normal.  ?   Left Ear: Tympanic membrane, ear canal and external ear normal.  ?   Nose: Nose normal.  ?Eyes:  ?   Conjunctiva/sclera: Conjunctivae normal.  ?   Pupils: Pupils are equal, round, and reactive to light.  ?Neck:  ?   Thyroid: No thyromegaly.  ?   Vascular: No carotid bruit.  ?Cardiovascular:  ?   Rate and Rhythm: Normal rate and regular rhythm.  ?   Heart sounds: Normal heart sounds.  ?Pulmonary:  ?   Effort: Pulmonary effort is normal. No respiratory distress.  ?   Breath sounds: Normal breath sounds. No wheezing.  ?Chest:  ?Breasts: ?   Right: No  mass.  ?   Left: No mass.  ?Abdominal:  ?   General: Bowel sounds are normal.  ?   Palpations: Abdomen is soft.  ?   Tenderness: There is no abdominal tenderness.  ?Musculoskeletal:     ?   General: Normal range of motion.  ?   Cervical back: Normal range of motion and neck supple.  ?Lymphadenopathy:  ?   Cervical: No cervical adenopathy.  ?Skin: ?   General: Skin is warm and dry.  ?   Findings: No rash.  ?Neurological:  ?   Mental Status: He is alert and oriented to person, place, and time.  ?   Deep Tendon Reflexes: Reflexes are normal and symmetric.  ?Psychiatric:     ?   Attention and Perception: Attention normal.     ?   Mood and Affect: Mood normal.     ?   Behavior: Behavior normal.     ?   Thought Content: Thought content normal.  ? ? ?Wt Readings from Last 3 Encounters:  ?09/21/21 168 lb (76.2 kg)  ?08/09/21 168 lb (76.2 kg)  ?08/02/21 170 lb (77.1 kg)  ? ? ?BP 126/82   Pulse 65   Ht '5\' 8"'  (1.727 m)   Wt 168 lb (76.2 kg)   SpO2 97%   BMI 25.54 kg/m?  ? ?Assessment and Plan: ?1. Annual physical exam ?Normal exam. ?Encourage regular exercise and continued healthy diet ? ?2. Need for hepatitis C screening  test ?- Hepatitis C antibody ? ?3. Benign essential HTN ?Clinically stable exam with well controlled BP. ?Tolerating medications without side effects at this time. ?Pt to continue current regimen and low sodium diet; benef

## 2021-09-22 LAB — COMPREHENSIVE METABOLIC PANEL
ALT: 16 IU/L (ref 0–44)
AST: 17 IU/L (ref 0–40)
Albumin/Globulin Ratio: 1.6 (ref 1.2–2.2)
Albumin: 4.3 g/dL (ref 3.8–4.8)
Alkaline Phosphatase: 57 IU/L (ref 44–121)
BUN/Creatinine Ratio: 16 (ref 10–24)
BUN: 22 mg/dL (ref 8–27)
Bilirubin Total: 0.8 mg/dL (ref 0.0–1.2)
CO2: 25 mmol/L (ref 20–29)
Calcium: 9.6 mg/dL (ref 8.6–10.2)
Chloride: 108 mmol/L — ABNORMAL HIGH (ref 96–106)
Creatinine, Ser: 1.39 mg/dL — ABNORMAL HIGH (ref 0.76–1.27)
Globulin, Total: 2.7 g/dL (ref 1.5–4.5)
Glucose: 85 mg/dL (ref 70–99)
Potassium: 4.9 mmol/L (ref 3.5–5.2)
Sodium: 144 mmol/L (ref 134–144)
Total Protein: 7 g/dL (ref 6.0–8.5)
eGFR: 57 mL/min/{1.73_m2} — ABNORMAL LOW (ref >59)

## 2021-09-22 LAB — CBC WITH DIFFERENTIAL/PLATELET
Basophils Absolute: 0.1 10*3/uL (ref 0.0–0.2)
Basos: 1 %
EOS (ABSOLUTE): 0.2 10*3/uL (ref 0.0–0.4)
Eos: 3 %
Hematocrit: 41.1 % (ref 37.5–51.0)
Hemoglobin: 13.6 g/dL (ref 13.0–17.7)
Immature Grans (Abs): 0 10*3/uL (ref 0.0–0.1)
Immature Granulocytes: 0 %
Lymphocytes Absolute: 2 10*3/uL (ref 0.7–3.1)
Lymphs: 31 %
MCH: 32.4 pg (ref 26.6–33.0)
MCHC: 33.1 g/dL (ref 31.5–35.7)
MCV: 98 fL — ABNORMAL HIGH (ref 79–97)
Monocytes Absolute: 0.4 10*3/uL (ref 0.1–0.9)
Monocytes: 6 %
Neutrophils Absolute: 3.8 10*3/uL (ref 1.4–7.0)
Neutrophils: 59 %
Platelets: 253 10*3/uL (ref 150–450)
RBC: 4.2 x10E6/uL (ref 4.14–5.80)
RDW: 11.7 % (ref 11.6–15.4)
WBC: 6.5 10*3/uL (ref 3.4–10.8)

## 2021-09-22 LAB — LIPID PANEL
Chol/HDL Ratio: 3.9 ratio (ref 0.0–5.0)
Cholesterol, Total: 174 mg/dL (ref 100–199)
HDL: 45 mg/dL (ref >39)
LDL Chol Calc (NIH): 115 mg/dL — ABNORMAL HIGH (ref 0–99)
Triglycerides: 76 mg/dL (ref 0–149)
VLDL Cholesterol Cal: 14 mg/dL (ref 5–40)

## 2021-09-22 LAB — HEPATITIS C ANTIBODY: Hep C Virus Ab: NONREACTIVE

## 2021-12-12 ENCOUNTER — Encounter: Payer: Self-pay | Admitting: Internal Medicine

## 2021-12-31 ENCOUNTER — Other Ambulatory Visit: Payer: Self-pay | Admitting: Internal Medicine

## 2022-01-01 NOTE — Telephone Encounter (Signed)
Requested Prescriptions  Pending Prescriptions Disp Refills  . losartan (COZAAR) 100 MG tablet [Pharmacy Med Name: LOSARTAN POTASSIUM 100 MG TAB] 90 tablet 0    Sig: TAKE 1 TABLET BY MOUTH EVERY DAY     Cardiovascular:  Angiotensin Receptor Blockers Failed - 12/31/2021  2:03 AM      Failed - Cr in normal range and within 180 days    Creatinine, Ser  Date Value Ref Range Status  09/21/2021 1.39 (H) 0.76 - 1.27 mg/dL Final         Passed - K in normal range and within 180 days    Potassium  Date Value Ref Range Status  09/21/2021 4.9 3.5 - 5.2 mmol/L Final         Passed - Patient is not pregnant      Passed - Last BP in normal range    BP Readings from Last 1 Encounters:  09/21/21 126/82         Passed - Valid encounter within last 6 months    Recent Outpatient Visits          3 months ago Annual physical exam   [facility] Glean Hess, MD   7 months ago Benign essential HTN   [facility] Glean Hess, MD      Future Appointments            In 1 month Hollice Espy, MD [place]

## 2022-01-18 ENCOUNTER — Ambulatory Visit: Payer: 59 | Admitting: Urology

## 2022-01-28 ENCOUNTER — Telehealth: Payer: Self-pay | Admitting: Urology

## 2022-01-28 NOTE — Telephone Encounter (Signed)
Pt called. He wants to find out if the 9/8 follow up is really necessary now?? He would like to move his appt out to the end of this year, if possibl.  Please call [phone].  Thank you.

## 2022-01-29 NOTE — Telephone Encounter (Signed)
Patient informed to keep follow up as schedule and get PSA prior.

## 2022-02-07 ENCOUNTER — Other Ambulatory Visit
Admission: RE | Admit: 2022-02-07 | Discharge: 2022-02-07 | Disposition: A | Discharge status: Home / Self Care | Payer: 59 | Attending: Urology | Admitting: Urology

## 2022-02-07 DIAGNOSIS — R972 Elevated prostate specific antigen [PSA]: Secondary | ICD-10-CM | POA: Insufficient documentation

## 2022-02-07 DIAGNOSIS — N400 Enlarged prostate without lower urinary tract symptoms: Secondary | ICD-10-CM | POA: Diagnosis not present

## 2022-02-07 LAB — PSA: Prostatic Specific Antigen: 5.98 ng/mL — ABNORMAL HIGH (ref 0.00–4.00)

## 2022-02-08 ENCOUNTER — Encounter: Payer: Self-pay | Admitting: Urology

## 2022-02-08 ENCOUNTER — Ambulatory Visit (INDEPENDENT_AMBULATORY_CARE_PROVIDER_SITE_OTHER): Payer: 59 | Admitting: Urology

## 2022-02-08 VITALS — BP 172/94 | HR 68 | Ht 68.0 in | Wt 158.0 lb

## 2022-02-08 DIAGNOSIS — N138 Other obstructive and reflux uropathy: Secondary | ICD-10-CM

## 2022-02-08 DIAGNOSIS — R972 Elevated prostate specific antigen [PSA]: Secondary | ICD-10-CM

## 2022-02-08 DIAGNOSIS — N401 Enlarged prostate with lower urinary tract symptoms: Secondary | ICD-10-CM

## 2022-02-08 NOTE — Progress Notes (Signed)
02/08/2022 10:02 AM   Brandon Mathews 30-May-1956 [id number]  Referring provider: Glean Hess, MD [address] [place],  [place] [address]  Chief Complaint  Patient presents with   Elevated PSA    HPI: 66 year old male with a personal history of right base nidus nodosum, BPH and history of elevated PSA returns today for 31-monthfollow-up.  He had a rising PSA up to 5.31 on 05/2021.  He ended up undergoing a prostate biopsy was negative on 08/2021.  He was noted to have a prostate volume of 121 g.  PSA continues to trend upwards, now at 5.98  Today he has no voiding complaints.  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  PMH: Past Medical History:  Diagnosis Date   Arthritis    BPH (benign prostatic hyperplasia)    Heartburn    Hematuria    HTN (hypertension)    Melanoma ([place]     Surgical History: Past Surgical History:  Procedure Laterality Date   MOHS SURGERY      Home Medications:  Allergies as of 02/08/2022   No Known Allergies      Medication List        Accurate as of February 08, 2022 10:02 AM. If you have any questions, ask your nurse or doctor.          clotrimazole-betamethasone cream Commonly known as: LOTRISONE   losartan 100 MG tablet Commonly known as: COZAAR TAKE 1 TABLET BY MOUTH EVERY DAY   Multi-Vitamins Tabs Take by mouth. Reported on 11/28/2015   tadalafil 20 MG tablet Commonly known as: CIALIS Take 1 tablet (20 mg total) by mouth daily as needed for erectile dysfunction. Reported on 12/22/2015   triamcinolone ointment 0.1 % Commonly known as: KENALOG        Allergies: No Known Allergies  Family History: Family History  Problem Relation Age of Onset   Breast cancer Sister    Kidney disease Neg Hx    Prostate cancer Neg Hx    Kidney  cancer Neg Hx    Bladder Cancer Neg Hx     Social History:  reports that he has quit smoking. He has never used smokeless tobacco. He reports current alcohol use. He reports that he does not use drugs.   Physical Exam: BP (!) 172/94   Pulse 68   Ht '5\' 8"'$  (1.727 m)   Wt 158 lb (71.7 kg)   BMI 24.02 kg/m   Constitutional:  Alert and oriented, No acute distress. HEENT: [place] AT, moist mucus membranes.  Trachea midline, no masses. Cardiovascular: No clubbing, cyanosis, or edema. Neurologic: Grossly intact, no focal deficits, moving all 4 extremities. Psychiatric: Normal mood and affect.  Laboratory Data: Lab Results  Component Value Date   WBC 6.5 09/21/2021   HGB 13.6 09/21/2021   HCT 41.1 09/21/2021   MCV 98 (H) 09/21/2021   PLT 253 09/21/2021    Lab Results  Component Value Date   CREATININE 1.39 (H) 09/21/2021    I personally reviewed his previous cross-sectional imaging including CT urogram from 2017 as well as CT of the pelvis in 2019 indicating a slightly enlarged left greater than right seminal vesicle.  Assessment & Plan:    1.  Elevated PSA PSA continues to rise despite negative biopsy 6 months ago  May be related  to enlarged prostate and age-related change as well as inflammation however there is some concern for possible sampling error based on the large volume of his gland.  We discussed alternatives moving forward which could include continued surveillance, consideration of prostate MRI and/or fusion biopsy depending on results amongst others.  We will hold off on the latter but if his PSA began continues to rise at the next interval, he is amenable to pursuing prostate MRI.   - PSA; Future  Hollice Espy, MD  [facility] [address], [place], [place] [address] [phone]

## 2022-04-27 ENCOUNTER — Other Ambulatory Visit: Payer: Self-pay | Admitting: Internal Medicine

## 2022-04-29 NOTE — Telephone Encounter (Signed)
Courtesy refill - Patient will need an office visit for further refills. Requested Prescriptions  Pending Prescriptions Disp Refills   losartan (COZAAR) 100 MG tablet [Pharmacy Med Name: LOSARTAN POTASSIUM 100 MG TAB] 30 tablet 0    Sig: TAKE 1 TABLET BY MOUTH EVERY DAY     Cardiovascular:  Angiotensin Receptor Blockers Failed - 04/27/2022  1:58 AM      Failed - Cr in normal range and within 180 days    Creatinine, Ser  Date Value Ref Range Status  09/21/2021 1.39 (H) 0.76 - 1.27 mg/dL Final         Failed - K in normal range and within 180 days    Potassium  Date Value Ref Range Status  09/21/2021 4.9 3.5 - 5.2 mmol/L Final         Failed - Last BP in normal range    BP Readings from Last 1 Encounters:  02/08/22 (!) 172/94         Failed - Valid encounter within last 6 months    Recent Outpatient Visits           7 months ago Annual physical exam   [facility], Jesse Sans, MD   11 months ago Benign essential HTN   [facility], Jesse Sans, MD       Future Appointments             In 3 months Hollice Espy, MD [facility] - Patient is not pregnant

## 2022-04-29 NOTE — Telephone Encounter (Signed)
Called pt - LMOMTCB for appt.  

## 2022-05-13 ENCOUNTER — Ambulatory Visit (INDEPENDENT_AMBULATORY_CARE_PROVIDER_SITE_OTHER): Payer: 59 | Admitting: Internal Medicine

## 2022-05-13 ENCOUNTER — Encounter: Payer: Self-pay | Admitting: Internal Medicine

## 2022-05-13 VITALS — BP 124/78 | HR 68 | Ht 68.0 in | Wt 163.4 lb

## 2022-05-13 DIAGNOSIS — M1712 Unilateral primary osteoarthritis, left knee: Secondary | ICD-10-CM

## 2022-05-13 DIAGNOSIS — I10 Essential (primary) hypertension: Secondary | ICD-10-CM

## 2022-05-13 DIAGNOSIS — N529 Male erectile dysfunction, unspecified: Secondary | ICD-10-CM

## 2022-05-13 MED ORDER — TADALAFIL 20 MG PO TABS: 20.0000 mg | ORAL_TABLET | Freq: Every day | ORAL | 5 refills | Status: AC | PRN

## 2022-05-13 MED ORDER — LOSARTAN POTASSIUM 100 MG PO TABS: 100.0000 mg | ORAL_TABLET | Freq: Every day | ORAL | 1 refills | Status: DC

## 2022-05-13 NOTE — Progress Notes (Signed)
Date:  05/13/2022   Name:  Brandon Mathews   DOB:  Nov 09, 1955   MRN:  [id number]   Chief Complaint: Hypertension  Hypertension This is a chronic problem. The problem is unchanged. The problem is controlled (bp at home 786 systolic). Pertinent negatives include no chest pain, headaches or shortness of breath. There are no associated agents to hypertension. Past treatments include angiotensin blockers. Hypertensive end-organ damage includes kidney disease. There is no history of CAD/MI or CVA.  Erectile Dysfunction This is a chronic problem. The nature of his difficulty is maintaining erection. He reports no anxiety. Pertinent negatives include no chills. Past treatments include tadalafil. The treatment provided significant (also tried Viagra due to cost but wants to go back to cialis.) relief.    Lab Results  Component Value Date   NA 144 09/21/2021   K 4.9 09/21/2021   CO2 25 09/21/2021   GLUCOSE 85 09/21/2021   BUN 22 09/21/2021   CREATININE 1.39 (H) 09/21/2021   CALCIUM 9.6 09/21/2021   EGFR 57 (L) 09/21/2021   GFRNONAA 56 01/21/2020   Lab Results  Component Value Date   CHOL 174 09/21/2021   HDL 45 09/21/2021   LDLCALC 115 (H) 09/21/2021   TRIG 76 09/21/2021   CHOLHDL 3.9 09/21/2021   No results found for: "TSH" No results found for: "HGBA1C" Lab Results  Component Value Date   WBC 6.5 09/21/2021   HGB 13.6 09/21/2021   HCT 41.1 09/21/2021   MCV 98 (H) 09/21/2021   PLT 253 09/21/2021   Lab Results  Component Value Date   ALT 16 09/21/2021   AST 17 09/21/2021   ALKPHOS 57 09/21/2021   BILITOT 0.8 09/21/2021   No results found for: "25OHVITD2", "25OHVITD3", "VD25OH"   Review of Systems  Constitutional:  Negative for chills, fatigue and fever.  HENT:  Negative for trouble swallowing.   Respiratory:  Negative for chest tightness and shortness of breath.   Cardiovascular:  Negative for chest pain and leg swelling.  Musculoskeletal:  Positive for  arthralgias (left knee OA worsening).  Neurological:  Negative for dizziness and headaches.  Psychiatric/Behavioral:  Negative for dysphoric mood and sleep disturbance. The patient is not nervous/anxious.     Patient Active Problem List   Diagnosis Date Noted   Chronic renal insufficiency, stage 3 (moderate) ([place]) 04/25/2019   Left renal mass 01/02/2017   ED (erectile dysfunction) of organic origin 12/22/2015   Acid reflux 12/22/2015   Adaptive colitis 12/22/2015   CA of skin 12/22/2015   BPH with obstruction/lower urinary tract symptoms 12/01/2015   Arthralgia of hip 09/06/2015   Other long term (current) drug therapy 12/29/2014   Degeneration of intervertebral disc of lumbar region 12/23/2013   Can't get food down 12/23/2013   Benign essential HTN 12/23/2013   Hypercholesterolemia 12/23/2013   H/O Malignant melanoma 07/04/2012    No Known Allergies  Past Surgical History:  Procedure Laterality Date   MOHS SURGERY      Social History   Tobacco Use   Smoking status: Former   Smokeless tobacco: Never   Tobacco comments:    smoked for 25 years but has quit for 15 years   Substance Use Topics   Alcohol use: Yes    Alcohol/week: 0.0 standard drinks of alcohol    Comment: social   Drug use: No     Medication list has been reviewed and updated.  Current Meds  Medication Sig   clotrimazole-betamethasone (LOTRISONE) cream  losartan (COZAAR) 100 MG tablet TAKE 1 TABLET BY MOUTH EVERY DAY   Multiple Vitamin (MULTI-VITAMINS) TABS Take by mouth. Reported on 11/28/2015   tadalafil (CIALIS) 20 MG tablet Take 1 tablet (20 mg total) by mouth daily as needed for erectile dysfunction. Reported on 12/22/2015   triamcinolone ointment (KENALOG) 0.1 %        05/13/2022    4:04 PM 09/21/2021    7:59 AM 05/21/2021    3:09 PM  GAD 7 : Generalized Anxiety Score  Nervous, Anxious, on Edge 0 0 0  Control/stop worrying 0 0 1  Worry too much - different things 1 0 0  Trouble  relaxing 0 0 1  Restless 0 0 0  Easily annoyed or irritable 0 0 0  Afraid - awful might happen 0 0 0  Total GAD 7 Score 1 0 2  Anxiety Difficulty Not difficult at all Not difficult at all Not difficult at all       05/13/2022    4:03 PM 09/21/2021    7:59 AM 05/21/2021    3:09 PM  Depression screen PHQ 2/9  Decreased Interest 0 0 0  Down, Depressed, Hopeless 0 0 0  PHQ - 2 Score 0 0 0  Altered sleeping 0 0 1  Tired, decreased energy 1 0 1  Change in appetite 0 0 0  Feeling bad or failure about yourself  0 0 0  Trouble concentrating 0 0 0  Moving slowly or fidgety/restless 0 0 0  Suicidal thoughts 0 0 0  PHQ-9 Score 1 0 2  Difficult doing work/chores Not difficult at all Not difficult at all Somewhat difficult    BP Readings from Last 3 Encounters:  05/13/22 124/78  02/08/22 (!) 172/94  09/21/21 126/82    Physical Exam Vitals and nursing note reviewed.  Constitutional:      General: He is not in acute distress.    Appearance: Normal appearance. He is well-developed.  HENT:     Head: Normocephalic and atraumatic.  Neck:     Vascular: No carotid bruit.  Cardiovascular:     Rate and Rhythm: Normal rate and regular rhythm.  Pulmonary:     Effort: Pulmonary effort is normal. No respiratory distress.     Breath sounds: No wheezing or rhonchi.  Musculoskeletal:     Cervical back: Normal range of motion.     Left knee: No effusion. Normal range of motion. No MCL laxity or ACL laxity.    Instability Tests: Anterior drawer test negative.     Right lower leg: No edema.     Left lower leg: No edema.  Lymphadenopathy:     Cervical: No cervical adenopathy.  Skin:    General: Skin is warm and dry.     Findings: No rash.  Neurological:     Mental Status: He is alert and oriented to person, place, and time.  Psychiatric:        Mood and Affect: Mood normal.        Behavior: Behavior normal.     Wt Readings from Last 3 Encounters:  05/13/22 163 lb 6.4 oz (74.1 kg)   02/08/22 158 lb (71.7 kg)  09/21/21 168 lb (76.2 kg)    BP 124/78   Pulse 68   Ht _0  (1.727 m)   Wt 163 lb 6.4 oz (74.1 kg)   SpO2 94%   BMI 24.84 kg/m   Assessment and Plan: 1. Benign essential HTN Clinically stable exam with well  controlled BP. Tolerating medications without side effects at this time. Pt to continue current regimen and low sodium diet; continue regular exercise as able . - losartan (COZAAR) 100 MG tablet; Take 1 tablet (100 mg total) by mouth daily.  Dispense: 90 tablet; Refill: 1  2. Primary osteoarthritis of left knee Recommend he follow up with Ortho Dr. Rudene Christians Avoid NSAIDS but can use tylenol.  3. ED (erectile dysfunction) of organic origin Stop Viagra and resume Cialis. - tadalafil (CIALIS) 20 MG tablet; Take 1 tablet (20 mg total) by mouth daily as needed for erectile dysfunction. Reported on 12/22/2015  Dispense: 10 tablet; Refill: 5    Partially dictated using Editor, commissioning. Any errors are unintentional.  Halina Maidens, MD [facility]  05/13/2022

## 2022-08-07 ENCOUNTER — Other Ambulatory Visit
Admission: RE | Admit: 2022-08-07 | Discharge: 2022-08-07 | Disposition: A | Discharge status: Home / Self Care | Payer: 59 | Attending: Urology | Admitting: Urology

## 2022-08-07 ENCOUNTER — Ambulatory Visit: Admission: RE | Admit: 2022-08-07 | Payer: 59 | Source: Home / Self Care

## 2022-08-07 DIAGNOSIS — R972 Elevated prostate specific antigen [PSA]: Secondary | ICD-10-CM | POA: Diagnosis present

## 2022-08-07 DIAGNOSIS — N400 Enlarged prostate without lower urinary tract symptoms: Secondary | ICD-10-CM | POA: Diagnosis present

## 2022-08-07 LAB — PSA: Prostatic Specific Antigen: 5.96 ng/mL — ABNORMAL HIGH (ref 0.00–4.00)

## 2022-08-09 ENCOUNTER — Encounter: Payer: Self-pay | Admitting: Urology

## 2022-08-09 ENCOUNTER — Ambulatory Visit (INDEPENDENT_AMBULATORY_CARE_PROVIDER_SITE_OTHER): Payer: 59 | Admitting: Urology

## 2022-08-09 VITALS — BP 149/87 | HR 69 | Ht 68.0 in | Wt 154.6 lb

## 2022-08-09 DIAGNOSIS — N400 Enlarged prostate without lower urinary tract symptoms: Secondary | ICD-10-CM

## 2022-08-09 DIAGNOSIS — R972 Elevated prostate specific antigen [PSA]: Secondary | ICD-10-CM | POA: Diagnosis not present

## 2022-08-09 DIAGNOSIS — N401 Enlarged prostate with lower urinary tract symptoms: Secondary | ICD-10-CM | POA: Diagnosis not present

## 2022-08-09 DIAGNOSIS — R3912 Poor urinary stream: Secondary | ICD-10-CM

## 2022-08-09 NOTE — Progress Notes (Signed)
Haze Rushing Plume,acting as a scribe for Hollice Espy, MD.,have documented all relevant documentation on the behalf of Hollice Espy, MD,as directed by  Hollice Espy, MD while in the presence of Hollice Espy, MD.  08/09/2022 9:02 AM   Brandon Mathews 21-Jul-1955 [id number]  Referring provider: Glean Hess, MD [address] [place],  [place] [address]  Chief Complaint  Patient presents with   Follow-up    HPI: 67 year old male who presents today for a 6 month follow up. He has a personal history of BPH, right vasitis nodosum, and elevated PSA.   His PSA elevated to 5.31 in 05/2021. He had a negative prostate biopsy in 08/2021. His prostate volume was remarkably elevated at 121 grams. His PSA has been trending up slightly.   His most recent PSA on 08/07/2022 was 5.96.  Today, he reports an occasional weak stream but it is not bothersome.   He has recently started taking Metamucil to increase his fiber intake.     Component     Latest Ref Rng 12/24/2019 01/19/2021 05/18/2021 02/07/2022 08/07/2022  Prostatic Specific Antigen     0.00 - 4.00 ng/mL 3.88  4.67 (H)  5.31 (H)  5.98 (H)  5.96 (H)     Legend: (H) High  PMH: Past Medical History:  Diagnosis Date   Arthritis    BPH (benign prostatic hyperplasia)    Heartburn    Hematuria    HTN (hypertension)    Melanoma ([place])     Surgical History: Past Surgical History:  Procedure Laterality Date   MOHS SURGERY      Home Medications:  Allergies as of 08/09/2022   No Known Allergies      Medication List        Accurate as of August 09, 2022  9:02 AM. If you have any questions, ask your nurse or doctor.          clotrimazole-betamethasone cream Commonly known as: LOTRISONE   losartan 100 MG tablet Commonly known as: COZAAR Take 1 tablet (100 mg total) by mouth daily.   Multi-Vitamins Tabs Take by mouth. Reported on 11/28/2015   tadalafil 20 MG tablet Commonly known as: CIALIS Take 1 tablet  (20 mg total) by mouth daily as needed for erectile dysfunction. Reported on 12/22/2015   triamcinolone ointment 0.1 % Commonly known as: KENALOG        Family History: Family History  Problem Relation Age of Onset   Breast cancer Sister    Kidney disease Neg Hx    Prostate cancer Neg Hx    Kidney cancer Neg Hx    Bladder Cancer Neg Hx     Social History:  reports that he has quit smoking. He has never used smokeless tobacco. He reports current alcohol use. He reports that he does not use drugs.   Physical Exam: BP (!) 149/87 (BP Location: Left Arm, Patient Position: Sitting, Cuff Size: Normal)   Pulse 69   Ht '5\' 8"'$  (1.727 m)   Wt 154 lb 9.6 oz (70.1 kg)   BMI 23.51 kg/m   Constitutional:  Alert and oriented, No acute distress. HEENT: [place] AT, moist mucus membranes.  Trachea midline, no masses. GU: Greater than 60 gram prostate. No nodularity. Neurologic: Grossly intact, no focal deficits, moving all 4 extremities. Psychiatric: Normal mood and affect.   Assessment & Plan:    1. Elevated PSA - Recent PSA was 5.96 and fairly stable.  - Continue serial PSA twice  a year.  - If his PSA rises significantly, we will plan for a prostate MRI.  2. BPH - Occasional weak stream is currently his only symptom. - DRE today with no nodules and greater than 60 gram prostate palpated.  - Will continue monitoring with annual DRE.    Return for psa in 6 months, and 1 yr f/u with PSA prior and DRE.  I have reviewed the above documentation for accuracy and completeness, and I agree with the above.   Hollice Espy, MD   [facility] [address] [place], [place] [address] [phone]

## 2022-10-04 ENCOUNTER — Ambulatory Visit (INDEPENDENT_AMBULATORY_CARE_PROVIDER_SITE_OTHER): Payer: 59 | Admitting: Internal Medicine

## 2022-10-04 ENCOUNTER — Encounter: Payer: Self-pay | Admitting: Internal Medicine

## 2022-10-04 VITALS — BP 128/78 | HR 74 | Ht 68.0 in | Wt 159.0 lb

## 2022-10-04 DIAGNOSIS — N183 Chronic kidney disease, stage 3 unspecified: Secondary | ICD-10-CM

## 2022-10-04 DIAGNOSIS — E7800 Pure hypercholesterolemia, unspecified: Secondary | ICD-10-CM | POA: Diagnosis not present

## 2022-10-04 DIAGNOSIS — Z23 Encounter for immunization: Secondary | ICD-10-CM

## 2022-10-04 DIAGNOSIS — I10 Essential (primary) hypertension: Secondary | ICD-10-CM

## 2022-10-04 DIAGNOSIS — Z0000 Encounter for general adult medical examination without abnormal findings: Secondary | ICD-10-CM | POA: Diagnosis not present

## 2022-10-04 DIAGNOSIS — N138 Other obstructive and reflux uropathy: Secondary | ICD-10-CM

## 2022-10-04 DIAGNOSIS — N401 Enlarged prostate with lower urinary tract symptoms: Secondary | ICD-10-CM | POA: Diagnosis not present

## 2022-10-04 DIAGNOSIS — M1712 Unilateral primary osteoarthritis, left knee: Secondary | ICD-10-CM

## 2022-10-04 LAB — POCT URINALYSIS DIPSTICK
Bilirubin, UA: NEGATIVE
Glucose, UA: NEGATIVE
Ketones, UA: NEGATIVE
Leukocytes, UA: NEGATIVE
Nitrite, UA: NEGATIVE
Protein, UA: NEGATIVE
Spec Grav, UA: 1.025 (ref 1.010–1.025)
Urobilinogen, UA: 0.2 E.U./dL
pH, UA: 6 (ref 5.0–8.0)

## 2022-10-04 NOTE — Assessment & Plan Note (Signed)
Controlled with diet 

## 2022-10-04 NOTE — Assessment & Plan Note (Signed)
Followed by Urology Last PSA 5.96

## 2022-10-04 NOTE — Patient Instructions (Signed)
Boswellia - joint supplement  -It was a pleasure to see you today! Please review your visit summary for helpful information. -Lab results are usually available within 1-2 days and we will call once reviewed. -I would encourage you to follow your care via MyChart where you can access lab results, notes, messages, and more. -If you feel that we did a nice job today, please complete your after-visit survey and leave [place] a Google review! Your CMA today was Chassidy and your provider was Dr Bari Edward, MD.

## 2022-10-04 NOTE — Assessment & Plan Note (Signed)
Stable exam with well controlled BP.  Currently taking losartan 100 mg. Tolerating medications without concerns or side effects. Will continue to recommend low sodium diet and current regimen.

## 2022-10-04 NOTE — Assessment & Plan Note (Signed)
Recommend a trial of Osteo-bioflex with Boswellia

## 2022-10-04 NOTE — Progress Notes (Signed)
Date:  10/04/2022   Name:  Brandon Mathews   DOB:  05-31-1956   MRN:  [id number]   Chief Complaint: Annual Exam Brandon Mathews is a 67 y.o. male who presents today for his Complete Annual Exam. He feels well. He reports exercising. He reports he is sleeping poorly recently due to recent activity/changes at home, etc.  Colonoscopy: 05/2018 repeat 10 yrs  Immunization History  Administered Date(s) Administered   Influenza Inj Mdck Quad Pf 04/26/2019   Influenza,inj,Quad PF,6+ Mos 04/26/2019   Influenza-Unspecified 03/01/2021, 04/03/2022   PFIZER Comirnaty(Gray Top)Covid-19 Tri-Sucrose Vaccine 08/20/2019, 09/09/2019   PFIZER(Purple Top)SARS-COV-2 Vaccination 08/20/2019, 09/09/2019, 05/18/2020, 04/03/2022   Pfizer Covid-19 Vaccine Bivalent Booster 19yrs & up 03/30/2021   Tdap 04/20/2009, 01/21/2019   Zoster Recombinat (Shingrix) 01/19/2018, 07/06/2018   Health Maintenance Due  Topic Date Due   HIV Screening  Never done   COVID-19 Vaccine (8 - 2023-24 season) 05/29/2022   Pneumonia Vaccine 70+ Years old (1 of 1 - PCV) Never done    Lab Results  Component Value Date   PSA1 2.6 11/28/2015   PSA 4.33 01/21/2020    Hypertension This is a chronic problem. The problem is controlled. Pertinent negatives include no chest pain, headaches, palpitations or shortness of breath. Past treatments include angiotensin blockers. Hypertensive end-organ damage includes kidney disease. There is no history of CAD/MI or CVA.    Lab Results  Component Value Date   NA 144 09/21/2021   K 4.9 09/21/2021   CO2 25 09/21/2021   GLUCOSE 85 09/21/2021   BUN 22 09/21/2021   CREATININE 1.39 (H) 09/21/2021   CALCIUM 9.6 09/21/2021   EGFR 57 (L) 09/21/2021   GFRNONAA 56 01/21/2020   Lab Results  Component Value Date   CHOL 174 09/21/2021   HDL 45 09/21/2021   LDLCALC 115 (H) 09/21/2021   TRIG 76 09/21/2021   CHOLHDL 3.9 09/21/2021   No results found for: "TSH" No results found for:  "HGBA1C" Lab Results  Component Value Date   WBC 6.5 09/21/2021   HGB 13.6 09/21/2021   HCT 41.1 09/21/2021   MCV 98 (H) 09/21/2021   PLT 253 09/21/2021   Lab Results  Component Value Date   ALT 16 09/21/2021   AST 17 09/21/2021   ALKPHOS 57 09/21/2021   BILITOT 0.8 09/21/2021   No results found for: "25OHVITD2", "25OHVITD3", "VD25OH"   Review of Systems  Constitutional:  Negative for appetite change, chills, diaphoresis, fatigue and unexpected weight change.  HENT:  Negative for hearing loss, tinnitus, trouble swallowing and voice change.   Eyes:  Negative for visual disturbance.  Respiratory:  Negative for choking, shortness of breath and wheezing.   Cardiovascular:  Negative for chest pain, palpitations and leg swelling.  Gastrointestinal:  Negative for abdominal pain, blood in stool, constipation and diarrhea.  Genitourinary:  Negative for difficulty urinating, dysuria and frequency.  Musculoskeletal:  Negative for arthralgias, back pain and myalgias.  Skin:  Negative for color change and rash.  Neurological:  Negative for dizziness, syncope and headaches.  Hematological:  Negative for adenopathy.  Psychiatric/Behavioral:  Negative for dysphoric mood and sleep disturbance. The patient is not nervous/anxious.     Patient Active Problem List   Diagnosis Date Noted   Primary osteoarthritis of left knee 10/04/2022   Chronic renal insufficiency, stage 3 (moderate) (HCC) 04/25/2019   Left renal mass 01/02/2017   ED (erectile dysfunction) of organic origin 12/22/2015   Acid reflux 12/22/2015   Adaptive  colitis 12/22/2015   CA of skin 12/22/2015   BPH with obstruction/lower urinary tract symptoms 12/01/2015   Arthralgia of hip 09/06/2015   Other long term (current) drug therapy 12/29/2014   Degeneration of intervertebral disc of lumbar region 12/23/2013   Dysphagia 12/23/2013   Benign essential HTN 12/23/2013   Hypercholesterolemia 12/23/2013   H/O Malignant melanoma  07/04/2012    No Known Allergies  Past Surgical History:  Procedure Laterality Date   MOHS SURGERY      Social History   Tobacco Use   Smoking status: Former   Smokeless tobacco: Never   Tobacco comments:    smoked for 25 years but has quit for 15 years   Substance Use Topics   Alcohol use: Yes    Alcohol/week: 0.0 standard drinks of alcohol    Comment: social   Drug use: No     Medication list has been reviewed and updated.  Current Meds  Medication Sig   clotrimazole-betamethasone (LOTRISONE) cream    losartan (COZAAR) 100 MG tablet Take 1 tablet (100 mg total) by mouth daily.   Multiple Vitamin (MULTI-VITAMINS) TABS Take by mouth. Reported on 11/28/2015   tadalafil (CIALIS) 20 MG tablet Take 1 tablet (20 mg total) by mouth daily as needed for erectile dysfunction. Reported on 12/22/2015   triamcinolone ointment (KENALOG) 0.1 %        10/04/2022    8:46 AM 05/13/2022    4:04 PM 09/21/2021    7:59 AM 05/21/2021    3:09 PM  GAD 7 : Generalized Anxiety Score  Nervous, Anxious, on Edge 1 0 0 0  Control/stop worrying 1 0 0 1  Worry too much - different things 1 1 0 0  Trouble relaxing 0 0 0 1  Restless 0 0 0 0  Easily annoyed or irritable 1 0 0 0  Afraid - awful might happen 0 0 0 0  Total GAD 7 Score 4 1 0 2  Anxiety Difficulty Not difficult at all Not difficult at all Not difficult at all Not difficult at all       10/04/2022    8:46 AM 05/13/2022    4:03 PM 09/21/2021    7:59 AM  Depression screen PHQ 2/9  Decreased Interest 0 0 0  Down, Depressed, Hopeless 0 0 0  PHQ - 2 Score 0 0 0  Altered sleeping 1 0 0  Tired, decreased energy 1 1 0  Change in appetite 0 0 0  Feeling bad or failure about yourself  0 0 0  Trouble concentrating 1 0 0  Moving slowly or fidgety/restless 0 0 0  Suicidal thoughts 0 0 0  PHQ-9 Score 3 1 0  Difficult doing work/chores Not difficult at all Not difficult at all Not difficult at all    BP Readings from Last 3 Encounters:   10/04/22 128/78  08/09/22 (!) 149/87  05/13/22 124/78    Physical Exam Vitals and nursing note reviewed.  Constitutional:      Appearance: Normal appearance. He is well-developed.  HENT:     Head: Normocephalic.     Right Ear: Tympanic membrane, ear canal and external ear normal.     Left Ear: Tympanic membrane, ear canal and external ear normal.     Nose: Nose normal.  Eyes:     Conjunctiva/sclera: Conjunctivae normal.     Pupils: Pupils are equal, round, and reactive to light.  Neck:     Thyroid: No thyromegaly.     Vascular:  No carotid bruit.  Cardiovascular:     Rate and Rhythm: Normal rate and regular rhythm.     Heart sounds: Normal heart sounds.  Pulmonary:     Effort: Pulmonary effort is normal.     Breath sounds: Normal breath sounds. No wheezing.  Chest:  Breasts:    Right: No mass.     Left: No mass.  Abdominal:     General: Bowel sounds are normal.     Palpations: Abdomen is soft.     Tenderness: There is no abdominal tenderness.  Musculoskeletal:        General: Tenderness (left lateral knee) present. No swelling. Normal range of motion.     Cervical back: Normal range of motion and neck supple.     Right lower leg: No edema.     Left lower leg: No edema.  Lymphadenopathy:     Cervical: No cervical adenopathy.  Skin:    General: Skin is warm and dry.  Neurological:     General: No focal deficit present.     Mental Status: He is alert and oriented to person, place, and time.     Deep Tendon Reflexes: Reflexes are normal and symmetric.  Psychiatric:        Attention and Perception: Attention normal.        Mood and Affect: Mood normal.        Thought Content: Thought content normal.     Wt Readings from Last 3 Encounters:  10/04/22 159 lb (72.1 kg)  08/09/22 154 lb 9.6 oz (70.1 kg)  05/13/22 163 lb 6.4 oz (74.1 kg)    BP 128/78   Pulse 74   Ht 5\' 8"  (1.727 m)   Wt 159 lb (72.1 kg)   SpO2 97%   BMI 24.18 kg/m   Assessment and  Plan:  Problem List Items Addressed This Visit       Cardiovascular and Mediastinum   Benign essential HTN (Chronic)    Stable exam with well controlled BP.  Currently taking losartan 100 mg. Tolerating medications without concerns or side effects. Will continue to recommend low sodium diet and current regimen.       Relevant Orders   CBC with Differential/Platelet   POCT urinalysis dipstick     Musculoskeletal and Integument   Primary osteoarthritis of left knee    Recommend a trial of Osteo-bioflex with Boswellia        Genitourinary   BPH with obstruction/lower urinary tract symptoms    Followed by Urology Last PSA 5.96      Chronic renal insufficiency, stage 3 (moderate) (HCC) (Chronic)    Stable GFR - continue yearly monitoring      Relevant Orders   Comprehensive metabolic panel     Other   Hypercholesterolemia (Chronic)    Controlled with diet.      Relevant Orders   Lipid panel   Other Visit Diagnoses     Annual physical exam    -  Primary   Normal exam continue healthy diet and exercise   Need for vaccination for pneumococcus       He declines today will administer next visit       Return in about 6 months (around 04/06/2023) for HTN.   Partially dictated using Dragon software, any errors are not intentional.  Reubin Milan, MD [facility] [place], [place]

## 2022-10-04 NOTE — Assessment & Plan Note (Signed)
Stable GFR - continue yearly monitoring

## 2022-10-05 LAB — CBC WITH DIFFERENTIAL/PLATELET
Basophils Absolute: 0 10*3/uL (ref 0.0–0.2)
Basos: 0 %
EOS (ABSOLUTE): 0.1 10*3/uL (ref 0.0–0.4)
Eos: 2 %
Hematocrit: 39.1 % (ref 37.5–51.0)
Hemoglobin: 13.2 g/dL (ref 13.0–17.7)
Immature Grans (Abs): 0 10*3/uL (ref 0.0–0.1)
Immature Granulocytes: 0 %
Lymphocytes Absolute: 1.6 10*3/uL (ref 0.7–3.1)
Lymphs: 22 %
MCH: 34.2 pg — ABNORMAL HIGH (ref 26.6–33.0)
MCHC: 33.8 g/dL (ref 31.5–35.7)
MCV: 101 fL — ABNORMAL HIGH (ref 79–97)
Monocytes Absolute: 0.3 10*3/uL (ref 0.1–0.9)
Monocytes: 4 %
Neutrophils Absolute: 5.3 10*3/uL (ref 1.4–7.0)
Neutrophils: 72 %
Platelets: 242 10*3/uL (ref 150–450)
RBC: 3.86 x10E6/uL — ABNORMAL LOW (ref 4.14–5.80)
RDW: 12.1 % (ref 11.6–15.4)
WBC: 7.4 10*3/uL (ref 3.4–10.8)

## 2022-10-05 LAB — COMPREHENSIVE METABOLIC PANEL
ALT: 14 IU/L (ref 0–44)
AST: 14 IU/L (ref 0–40)
Albumin/Globulin Ratio: 1.6 (ref 1.2–2.2)
Albumin: 4.3 g/dL (ref 3.9–4.9)
Alkaline Phosphatase: 55 IU/L (ref 44–121)
BUN/Creatinine Ratio: 13 (ref 10–24)
BUN: 18 mg/dL (ref 8–27)
Bilirubin Total: 1 mg/dL (ref 0.0–1.2)
CO2: 23 mmol/L (ref 20–29)
Calcium: 9.2 mg/dL (ref 8.6–10.2)
Chloride: 106 mmol/L (ref 96–106)
Creatinine, Ser: 1.34 mg/dL — ABNORMAL HIGH (ref 0.76–1.27)
Globulin, Total: 2.7 g/dL (ref 1.5–4.5)
Glucose: 91 mg/dL (ref 70–99)
Potassium: 4.5 mmol/L (ref 3.5–5.2)
Sodium: 142 mmol/L (ref 134–144)
Total Protein: 7 g/dL (ref 6.0–8.5)
eGFR: 59 mL/min/{1.73_m2} — ABNORMAL LOW (ref >59)

## 2022-10-05 LAB — LIPID PANEL
Chol/HDL Ratio: 3.1 ratio (ref 0.0–5.0)
Cholesterol, Total: 168 mg/dL (ref 100–199)
HDL: 55 mg/dL (ref >39)
LDL Chol Calc (NIH): 99 mg/dL (ref 0–99)
Triglycerides: 74 mg/dL (ref 0–149)
VLDL Cholesterol Cal: 14 mg/dL (ref 5–40)

## 2023-02-14 ENCOUNTER — Other Ambulatory Visit: Payer: Self-pay | Admitting: Internal Medicine

## 2023-02-14 DIAGNOSIS — I10 Essential (primary) hypertension: Secondary | ICD-10-CM

## 2023-04-08 ENCOUNTER — Ambulatory Visit (INDEPENDENT_AMBULATORY_CARE_PROVIDER_SITE_OTHER): Payer: 59 | Admitting: Internal Medicine

## 2023-04-08 ENCOUNTER — Encounter: Payer: Self-pay | Admitting: Internal Medicine

## 2023-04-08 VITALS — BP 128/70 | HR 69 | Ht 66.0 in | Wt 158.0 lb

## 2023-04-08 DIAGNOSIS — I10 Essential (primary) hypertension: Secondary | ICD-10-CM | POA: Diagnosis not present

## 2023-04-08 DIAGNOSIS — N183 Chronic kidney disease, stage 3 unspecified: Secondary | ICD-10-CM

## 2023-04-08 NOTE — Assessment & Plan Note (Addendum)
Controlled BP with normal exam. Current regimen is losartan. Will continue same medications; encourage continued reduced sodium diet. Increase fluid intake

## 2023-04-08 NOTE — Assessment & Plan Note (Signed)
Stable GFR in the 50's Encourage more fluid intake - recommend 64 + oz per day.

## 2023-04-08 NOTE — Progress Notes (Signed)
Date:  04/08/2023   Name:  Brandon Mathews   DOB:  [birth year]   MRN:  [id number]   Chief Complaint: Hypertension  Hypertension This is a chronic problem. The problem is controlled. Pertinent negatives include no chest pain, headaches, palpitations or shortness of breath. Past treatments include angiotensin blockers. The current treatment provides significant improvement. Hypertensive end-organ damage includes kidney disease. There is no history of CAD/MI or CVA.    Review of Systems  Constitutional:  Negative for chills, fatigue and unexpected weight change.  HENT:  Negative for nosebleeds.   Eyes:  Negative for visual disturbance.  Respiratory:  Negative for cough, chest tightness, shortness of breath and wheezing.   Cardiovascular:  Negative for chest pain, palpitations and leg swelling.  Gastrointestinal:  Negative for abdominal pain, constipation and diarrhea.  Neurological:  Negative for dizziness, weakness, light-headedness and headaches.  Psychiatric/Behavioral:  Negative for dysphoric mood and sleep disturbance. The patient is not nervous/anxious.      Lab Results  Component Value Date   NA 142 10/04/2022   K 4.5 10/04/2022   CO2 23 10/04/2022   GLUCOSE 91 10/04/2022   BUN 18 10/04/2022   CREATININE 1.34 (H) 10/04/2022   CALCIUM 9.2 10/04/2022   EGFR 59 (L) 10/04/2022   GFRNONAA 56 01/21/2020   Lab Results  Component Value Date   CHOL 168 10/04/2022   HDL 55 10/04/2022   LDLCALC 99 10/04/2022   TRIG 74 10/04/2022   CHOLHDL 3.1 10/04/2022   No results found for: "TSH" No results found for: "HGBA1C" Lab Results  Component Value Date   WBC 7.4 10/04/2022   HGB 13.2 10/04/2022   HCT 39.1 10/04/2022   MCV 101 (H) 10/04/2022   PLT 242 10/04/2022   Lab Results  Component Value Date   ALT 14 10/04/2022   AST 14 10/04/2022   ALKPHOS 55 10/04/2022   BILITOT 1.0 10/04/2022   No results found for: "25OHVITD2", "25OHVITD3", "VD25OH"   Patient Active  Problem List   Diagnosis Date Noted   Primary osteoarthritis of left knee 10/04/2022   Chronic renal insufficiency, stage 3 (moderate) (HCC) 04/25/2019   Left renal mass 01/02/2017   ED (erectile dysfunction) of organic origin 12/22/2015   Acid reflux 12/22/2015   Adaptive colitis 12/22/2015   CA of skin 12/22/2015   BPH with obstruction/lower urinary tract symptoms 12/01/2015   Arthralgia of hip 09/06/2015   Other long term (current) drug therapy 12/29/2014   Degeneration of intervertebral disc of lumbar region 12/23/2013   Dysphagia 12/23/2013   Benign essential HTN 12/23/2013   Hypercholesterolemia 12/23/2013   H/O Malignant melanoma 07/04/2012    No Known Allergies  Past Surgical History:  Procedure Laterality Date   MOHS SURGERY      Social History   Tobacco Use   Smoking status: Former   Smokeless tobacco: Never   Tobacco comments:    smoked for 25 years but has quit for 15 years   Substance Use Topics   Alcohol use: Yes    Alcohol/week: 0.0 standard drinks of alcohol    Comment: social   Drug use: No     Medication list has been reviewed and updated.  Current Meds  Medication Sig   clotrimazole-betamethasone (LOTRISONE) cream    losartan (COZAAR) 100 MG tablet TAKE 1 TABLET BY MOUTH EVERY DAY   Multiple Vitamin (MULTI-VITAMINS) TABS Take by mouth. Reported on 11/28/2015   tadalafil (CIALIS) 20 MG tablet Take 1 tablet (20 mg total)  by mouth daily as needed for erectile dysfunction. Reported on 12/22/2015   triamcinolone ointment (KENALOG) 0.1 %        04/08/2023    9:27 AM 10/04/2022    8:46 AM 05/13/2022    4:04 PM 09/21/2021    7:59 AM  GAD 7 : Generalized Anxiety Score  Nervous, Anxious, on Edge 0 1 0 0  Control/stop worrying 1 1 0 0  Worry too much - different things 1 1 1  0  Trouble relaxing 1 0 0 0  Restless 0 0 0 0  Easily annoyed or irritable 1 1 0 0  Afraid - awful might happen 0 0 0 0  Total GAD 7 Score 4 4 1  0  Anxiety Difficulty Somewhat  difficult Not difficult at all Not difficult at all Not difficult at all       04/08/2023    9:27 AM 10/04/2022    8:46 AM 05/13/2022    4:03 PM  Depression screen PHQ 2/9  Decreased Interest 0 0 0  Down, Depressed, Hopeless 0 0 0  PHQ - 2 Score 0 0 0  Altered sleeping 0 1 0  Tired, decreased energy 1 1 1   Change in appetite 0 0 0  Feeling bad or failure about yourself  0 0 0  Trouble concentrating 0 1 0  Moving slowly or fidgety/restless 0 0 0  Suicidal thoughts 0 0 0  PHQ-9 Score 1 3 1   Difficult doing work/chores Not difficult at all Not difficult at all Not difficult at all    BP Readings from Last 3 Encounters:  04/08/23 128/70  10/04/22 128/78  08/09/22 (!) 149/87    Physical Exam Vitals and nursing note reviewed.  Constitutional:      General: He is not in acute distress.    Appearance: Normal appearance. He is well-developed.  HENT:     Head: Normocephalic and atraumatic.  Neck:     Vascular: No carotid bruit.  Cardiovascular:     Rate and Rhythm: Normal rate and regular rhythm.  Pulmonary:     Effort: Pulmonary effort is normal. No respiratory distress.     Breath sounds: No wheezing or rhonchi.  Musculoskeletal:     Cervical back: Normal range of motion.     Right lower leg: No edema.     Left lower leg: No edema.  Lymphadenopathy:     Cervical: No cervical adenopathy.  Skin:    General: Skin is warm and dry.     Findings: No rash.  Neurological:     Mental Status: He is alert and oriented to person, place, and time.  Psychiatric:        Mood and Affect: Mood normal.        Behavior: Behavior normal.     Wt Readings from Last 3 Encounters:  04/08/23 158 lb (71.7 kg)  10/04/22 159 lb (72.1 kg)  08/09/22 154 lb 9.6 oz (70.1 kg)    BP 128/70 (BP Location: Left Arm, Cuff Size: Large)   Pulse 69   Ht 5\' 6"  (1.676 m)   Wt 158 lb (71.7 kg)   SpO2 94%   BMI 25.50 kg/m   Assessment and Plan:  Problem List Items Addressed This Visit        Unprioritized   Benign essential HTN - Primary (Chronic)    Controlled BP with normal exam. Current regimen is losartan. Will continue same medications; encourage continued reduced sodium diet. Increase fluid intake  Chronic renal insufficiency, stage 3 (moderate) (HCC) (Chronic)    Stable GFR in the 50's Encourage more fluid intake - recommend 64 + oz per day.       Return in about 6 months (around 10/06/2023) for CPX.    Reubin Milan, MD [facility] [place]

## 2023-04-17 ENCOUNTER — Ambulatory Visit: Payer: Self-pay | Admitting: *Deleted

## 2023-04-17 NOTE — Telephone Encounter (Signed)
  Chief Complaint: abdominal cramping / pain  Symptoms: abdominal cramping pain low abdomen. Thought it was food posioning at first by now only watery stools. LBM 1 week ago and pt pattern is daily. Has gas at times and feels urge to "go to bathroom" but unable to go . Has been taking metamucil.  Frequency: 1 week Pertinent Negatives: Patient denies N/V no fever no severe pain  Disposition: [] ED /[] Urgent Care (no appt availability in office) / [] Appointment(In office/virtual)/ []  [facility]/ [] Home Care/ [] Refused Recommended Disposition /[] [facility]/ [x]  Follow-up with PCP Additional Notes:   Time restraint to make appt at 1020. Patient afraid to scheduled My Chart appt . Please advise if patient needs to be seen in person. Offered My chart VV appt tomorrow. Please advise. Patient would like a call back.      Reason for Disposition  [1] MODERATE pain (e.g., interferes with normal activities) AND [2] pain comes and goes (cramps) AND [3] present > 24 hours  (Exception: Pain with Vomiting or Diarrhea - see that Guideline.)  Answer Assessment - Initial Assessment Questions 1. LOCATION: "Where does it hurt?"      Low abdomen 2. RADIATION: "Does the pain shoot anywhere else?" (e.g., chest, back)     No  3. ONSET: "When did the pain begin?" (Minutes, hours or days ago)      1 week ago 4. SUDDEN: "Gradual or sudden onset?"     Same as when started 5. PATTERN "Does the pain come and go, or is it constant?"    - If it comes and goes: "How long does it last?" "Do you have pain now?"     (Note: Comes and goes means the pain is intermittent. It goes away completely between bouts.)    - If constant: "Is it getting better, staying the same, or getting worse?"      (Note: Constant means the pain never goes away completely; most serious pain is constant and gets worse.)      Comes and goes  6. SEVERITY: "How bad is the pain?"  (e.g., Scale 1-10; mild, moderate, or severe)     - MILD (1-3): Doesn't interfere with normal activities, abdomen soft and not tender to touch.     - MODERATE (4-7): Interferes with normal activities or awakens from sleep, abdomen tender to touch.     - SEVERE (8-10): Excruciating pain, doubled over, unable to do any normal activities.       Always feel like have to go to bathroom but does not  7. RECURRENT SYMPTOM: "Have you ever had this type of stomach pain before?" If Yes, ask: "When was the last time?" and "What happened that time?"      Na  8. CAUSE: "What do you think is causing the stomach pain?"     Not sure  9. RELIEVING/AGGRAVATING FACTORS: "What makes it better or worse?" (e.g., antacids, bending or twisting motion, bowel movement)     Nothing  10. OTHER SYMPTOMS: "Do you have any other symptoms?" (e.g., back pain, diarrhea, fever, urination pain, vomiting)      Feels like needs to go to bathroom constantly , LBM 1 week ago pattern daily .  Protocols used: Abdominal Pain - Male-A-AH

## 2023-04-18 ENCOUNTER — Encounter: Payer: Self-pay | Admitting: Internal Medicine

## 2023-04-18 ENCOUNTER — Ambulatory Visit (INDEPENDENT_AMBULATORY_CARE_PROVIDER_SITE_OTHER): Payer: 59 | Admitting: Internal Medicine

## 2023-04-18 VITALS — BP 122/74 | HR 80 | Ht 66.0 in | Wt 151.0 lb

## 2023-04-18 DIAGNOSIS — R109 Unspecified abdominal pain: Secondary | ICD-10-CM

## 2023-04-18 NOTE — Progress Notes (Signed)
Date:  04/18/2023   Name:  Rudra Miner   DOB:  1956-05-18   MRN:  [id number]   Chief Complaint: Constipation (Abdominal cramping pain low abdomen. Thought it was food posioning at first by now only watery stools. Patient finally had a BM this morning but had not had one for one week before that. Has gas at times and feels urge to "go to bathroom" but unable to go . Has been taking metamucil. )  Abdominal Pain This is a new problem. The current episode started in the past 7 days. The onset quality is sudden. The problem occurs daily. The problem has been gradually improving. The pain is located in the generalized abdominal region. The pain is mild. The quality of the pain is cramping. The abdominal pain does not radiate. Associated symptoms include constipation and flatus. Pertinent negatives include no anorexia, belching, fever, melena, vomiting or weight loss. Nothing (started after eating at a [place]) aggravates the pain. The pain is relieved by Bowel movements. Treatments tried: has taken metamucil for the past 2 days. Improvement on treatment: had a small soft stool today.    Review of Systems  Constitutional:  Negative for chills, diaphoresis, fatigue, fever and weight loss.  Respiratory:  Negative for chest tightness and shortness of breath.   Cardiovascular:  Negative for chest pain and palpitations.  Gastrointestinal:  Positive for abdominal pain, constipation and flatus. Negative for anorexia, blood in stool, melena and vomiting.  Psychiatric/Behavioral:  Negative for dysphoric mood and sleep disturbance. The patient is not nervous/anxious.      Lab Results  Component Value Date   NA 142 10/04/2022   K 4.5 10/04/2022   CO2 23 10/04/2022   GLUCOSE 91 10/04/2022   BUN 18 10/04/2022   CREATININE 1.34 (H) 10/04/2022   CALCIUM 9.2 10/04/2022   EGFR 59 (L) 10/04/2022   GFRNONAA 56 01/21/2020   Lab Results  Component Value Date   CHOL 168 10/04/2022   HDL 55  10/04/2022   LDLCALC 99 10/04/2022   TRIG 74 10/04/2022   CHOLHDL 3.1 10/04/2022   No results found for: "TSH" No results found for: "HGBA1C" Lab Results  Component Value Date   WBC 7.4 10/04/2022   HGB 13.2 10/04/2022   HCT 39.1 10/04/2022   MCV 101 (H) 10/04/2022   PLT 242 10/04/2022   Lab Results  Component Value Date   ALT 14 10/04/2022   AST 14 10/04/2022   ALKPHOS 55 10/04/2022   BILITOT 1.0 10/04/2022   No results found for: "25OHVITD2", "25OHVITD3", "VD25OH"   Patient Active Problem List   Diagnosis Date Noted   Primary osteoarthritis of left knee 10/04/2022   Chronic renal insufficiency, stage 3 (moderate) (HCC) 04/25/2019   Left renal mass 01/02/2017   ED (erectile dysfunction) of organic origin 12/22/2015   Acid reflux 12/22/2015   Adaptive colitis 12/22/2015   CA of skin 12/22/2015   BPH with obstruction/lower urinary tract symptoms 12/01/2015   Arthralgia of hip 09/06/2015   Other long term (current) drug therapy 12/29/2014   Degeneration of intervertebral disc of lumbar region 12/23/2013   Dysphagia 12/23/2013   Benign essential HTN 12/23/2013   Hypercholesterolemia 12/23/2013   H/O Malignant melanoma 07/04/2012    No Known Allergies  Past Surgical History:  Procedure Laterality Date   MOHS SURGERY      Social History   Tobacco Use   Smoking status: Former   Smokeless tobacco: Never   Tobacco comments:  smoked for 25 years but has quit for 15 years   Substance Use Topics   Alcohol use: Yes    Alcohol/week: 0.0 standard drinks of alcohol    Comment: social   Drug use: No     Medication list has been reviewed and updated.  Current Meds  Medication Sig   clotrimazole-betamethasone (LOTRISONE) cream    losartan (COZAAR) 100 MG tablet TAKE 1 TABLET BY MOUTH EVERY DAY   Multiple Vitamin (MULTI-VITAMINS) TABS Take by mouth. Reported on 11/28/2015   tadalafil (CIALIS) 20 MG tablet Take 1 tablet (20 mg total) by mouth daily as needed for  erectile dysfunction. Reported on 12/22/2015   triamcinolone ointment (KENALOG) 0.1 %        04/08/2023    9:27 AM 10/04/2022    8:46 AM 05/13/2022    4:04 PM 09/21/2021    7:59 AM  GAD 7 : Generalized Anxiety Score  Nervous, Anxious, on Edge 0 1 0 0  Control/stop worrying 1 1 0 0  Worry too much - different things 1 1 1  0  Trouble relaxing 1 0 0 0  Restless 0 0 0 0  Easily annoyed or irritable 1 1 0 0  Afraid - awful might happen 0 0 0 0  Total GAD 7 Score 4 4 1  0  Anxiety Difficulty Somewhat difficult Not difficult at all Not difficult at all Not difficult at all       04/08/2023    9:27 AM 10/04/2022    8:46 AM 05/13/2022    4:03 PM  Depression screen PHQ 2/9  Decreased Interest 0 0 0  Down, Depressed, Hopeless 0 0 0  PHQ - 2 Score 0 0 0  Altered sleeping 0 1 0  Tired, decreased energy 1 1 1   Change in appetite 0 0 0  Feeling bad or failure about yourself  0 0 0  Trouble concentrating 0 1 0  Moving slowly or fidgety/restless 0 0 0  Suicidal thoughts 0 0 0  PHQ-9 Score 1 3 1   Difficult doing work/chores Not difficult at all Not difficult at all Not difficult at all    BP Readings from Last 3 Encounters:  04/18/23 122/74  04/08/23 128/70  10/04/22 128/78    Physical Exam Vitals and nursing note reviewed.  Constitutional:      General: He is not in acute distress.    Appearance: He is well-developed.  HENT:     Head: Normocephalic and atraumatic.  Cardiovascular:     Rate and Rhythm: Normal rate and regular rhythm.  Pulmonary:     Effort: Pulmonary effort is normal. No respiratory distress.     Breath sounds: No wheezing or rhonchi.  Abdominal:     General: Abdomen is flat. Bowel sounds are decreased.     Palpations: Abdomen is soft.     Tenderness: There is abdominal tenderness (mild discomfort) in the epigastric area. There is no right CVA tenderness, left CVA tenderness, guarding or rebound.  Musculoskeletal:     Cervical back: Normal range of motion.   Lymphadenopathy:     Cervical: No cervical adenopathy.  Skin:    General: Skin is warm and dry.     Findings: No rash.  Neurological:     Mental Status: He is alert and oriented to person, place, and time.  Psychiatric:        Mood and Affect: Mood normal.        Behavior: Behavior normal.     Wt  Readings from Last 3 Encounters:  04/18/23 151 lb (68.5 kg)  04/08/23 158 lb (71.7 kg)  10/04/22 159 lb (72.1 kg)    BP 122/74   Pulse 80   Ht 5\' 6"  (1.676 m)   Wt 151 lb (68.5 kg)   SpO2 96%   BMI 24.37 kg/m   Assessment and Plan:  Problem List Items Addressed This Visit   None Visit Diagnoses     Abdominal discomfort    -  Primary   uncertain etiology but no worrisone features or findings continue Metamucil daily if stool becomes liquid and sx continue, will get stool studies       No follow-ups on file.    Reubin Milan, MD [facility] [place]

## 2023-06-06 ENCOUNTER — Other Ambulatory Visit: Payer: Self-pay | Admitting: Internal Medicine

## 2023-06-06 DIAGNOSIS — I10 Essential (primary) hypertension: Secondary | ICD-10-CM

## 2023-06-09 NOTE — Telephone Encounter (Signed)
 Last reordered 02/14/23 #90 1 RF  Requested Prescriptions  Refused Prescriptions Disp Refills   losartan  (COZAAR ) 100 MG tablet [Pharmacy Med Name: LOSARTAN  POTASSIUM 100 MG TAB] 90 tablet 1    Sig: TAKE 1 TABLET BY MOUTH EVERY DAY     Cardiovascular:  Angiotensin Receptor Blockers Failed - 06/09/2023  3:29 PM      Failed - Cr in normal range and within 180 days    Creatinine, Ser  Date Value Ref Range Status  10/04/2022 1.34 (H) 0.76 - 1.27 mg/dL Final         Failed - K in normal range and within 180 days    Potassium  Date Value Ref Range Status  10/04/2022 4.5 3.5 - 5.2 mmol/L Final         Passed - Patient is not pregnant      Passed - Last BP in normal range    BP Readings from Last 1 Encounters:  04/18/23 122/74         Passed - Valid encounter within last 6 months    Recent Outpatient Visits           1 month ago Abdominal discomfort   [place] [facility], Leita DEL, MD   2 months ago Benign essential HTN   [place] [facility] at [facility], Leita DEL, MD   8 months ago Annual physical exam   [facility] at [facility], Leita DEL, MD   1 year ago Benign essential HTN   [place] [facility] at [facility], Leita DEL, MD   1 year ago Annual physical exam   [facility] at [facility], Leita DEL, MD       Future Appointments             In 2 months Penne Knee, MD [facility] [place]   In 4 months Adie Leita DEL, MD [facility] Primary [facility] & [facility] Medicine at [facility]

## 2023-08-08 ENCOUNTER — Ambulatory Visit: Payer: 59 | Admitting: Urology

## 2023-08-11 ENCOUNTER — Other Ambulatory Visit: Payer: Self-pay | Admitting: Urology

## 2023-08-11 ENCOUNTER — Other Ambulatory Visit
Admission: RE | Admit: 2023-08-11 | Discharge: 2023-08-11 | Disposition: A | Discharge status: Home / Self Care | Attending: Urology | Admitting: Urology

## 2023-08-11 ENCOUNTER — Telehealth: Payer: Self-pay | Admitting: Urology

## 2023-08-11 DIAGNOSIS — R972 Elevated prostate specific antigen [PSA]: Secondary | ICD-10-CM | POA: Diagnosis present

## 2023-08-11 LAB — PSA
PSA: 5.36
Prostatic Specific Antigen: 5.36 ng/mL — ABNORMAL HIGH (ref 0.00–4.00)

## 2023-08-11 NOTE — Telephone Encounter (Signed)
 Per chart this has been taking care of

## 2023-08-11 NOTE — Telephone Encounter (Signed)
 Patient went to the lab in [place] to have PSA done prior to his appt on 08/15/23. The order was expired, and lab needs new order. Patient requested phone call after new order is put in so he can go back to lab.

## 2023-08-15 ENCOUNTER — Ambulatory Visit (INDEPENDENT_AMBULATORY_CARE_PROVIDER_SITE_OTHER): Payer: 59 | Admitting: Urology

## 2023-08-15 ENCOUNTER — Encounter: Payer: Self-pay | Admitting: Internal Medicine

## 2023-08-15 ENCOUNTER — Encounter: Payer: Self-pay | Admitting: Urology

## 2023-08-15 VITALS — BP 181/105 | HR 76 | Ht 66.0 in | Wt 155.6 lb

## 2023-08-15 DIAGNOSIS — R972 Elevated prostate specific antigen [PSA]: Secondary | ICD-10-CM | POA: Diagnosis not present

## 2023-08-15 DIAGNOSIS — N138 Other obstructive and reflux uropathy: Secondary | ICD-10-CM | POA: Diagnosis not present

## 2023-08-15 DIAGNOSIS — N401 Enlarged prostate with lower urinary tract symptoms: Secondary | ICD-10-CM | POA: Diagnosis not present

## 2023-08-15 NOTE — Progress Notes (Signed)
 Marcelle Overlie Plume,acting as a scribe for Vanna Scotland, MD.,have documented all relevant documentation on the behalf of Vanna Scotland, MD,as directed by  Vanna Scotland, MD while in the presence of Vanna Scotland, MD.  08/15/23 11:29 AM   Brandon Mathews 1956/05/04 [id number]  Referring provider: Reubin Milan, MD [address] [place],  [place] [address]  Chief Complaint  Patient presents with   Elevated PSA    HPI: 68 year-old male with a history of BPH, vasitis nodosa, and elevated PSA. He was last seen a year ago and had a negative biopsy in 2023 with a prostate size of 121 grams.   His PSA has remained stable in the 5 range, which is appropriate for the size of his prostate. His most recent PSA on 08/11/2023 was 5.36. He is due for a rectal exam today.   He reports urinary symptoms that are sometimes uncomfortable, with difficulty initiating urination and variable flow. He has previously been on Flomax and Tamsulosin but discontinued them due to side effects. He is considering whether to resume medication or explore surgical options if symptoms worsen.     Prostatic Specific Antigen  Latest Ref Rng 0.00 - 4.00 ng/mL  12/24/2019 3.88   01/19/2021 4.67 (H)   05/18/2021 5.31 (H)   02/07/2022 5.98 (H)   08/07/2022 5.96 (H)   08/11/2023 5.36 (H)      PMH: Past Medical History:  Diagnosis Date   Arthritis    BPH (benign prostatic hyperplasia)    Elevated PSA    Heartburn    Hematuria    HTN (hypertension)    Melanoma (HCC)     Surgical History: Past Surgical History:  Procedure Laterality Date   MOHS SURGERY      Home Medications:  Allergies as of 08/15/2023   No Known Allergies      Medication List        Accurate as of August 15, 2023 11:29 AM. If you have any questions, ask your nurse or doctor.          STOP taking these medications    clotrimazole-betamethasone cream Commonly known as: LOTRISONE Stopped by: Vanna Scotland    triamcinolone ointment 0.1 % Commonly known as: KENALOG Stopped by: Vanna Scotland       TAKE these medications    losartan 100 MG tablet Commonly known as: COZAAR TAKE 1 TABLET BY MOUTH EVERY DAY   Multi-Vitamins Tabs Take by mouth. Reported on 11/28/2015   tadalafil 20 MG tablet Commonly known as: CIALIS Take 1 tablet (20 mg total) by mouth daily as needed for erectile dysfunction. Reported on 12/22/2015        Family History: Family History  Problem Relation Age of Onset   Breast cancer Sister    Kidney disease Neg Hx    Prostate cancer Neg Hx    Kidney cancer Neg Hx    Bladder Cancer Neg Hx     Social History:  reports that he has quit smoking. He has never used smokeless tobacco. He reports current alcohol use. He reports that he does not use drugs.   Physical Exam: BP (!) 181/105   Pulse 76   Ht 5\' 6"  (1.676 m)   Wt 155 lb 9.6 oz (70.6 kg)   SpO2 96%   BMI 25.11 kg/m   Constitutional:  Alert and oriented, No acute distress. HEENT: [place] AT, moist mucus membranes.  Trachea midline, no masses. GU: Enlarged prostate with no nodules, some  tissue noted. Neurologic: Grossly intact, no focal deficits, moving all 4 extremities. Psychiatric: Normal mood and affect.   Assessment & Plan:    1. BPH with LUTS - He has a history of BPH with a prostate size of 121 grams and stable PSA levels in the 5-6 range for several year now, which is appropriate for the gland size.  - He reports intermittent urinary symptoms, including difficulty initiating urination and variable flow.  - He has previously been on Flomax but discontinued due to side effects. He is not currently inclined to restart medication.  - Discussed the option of surgical intervention if symptoms worsen, which would involve procedures such as TURP or laser therapy.  - Advised to monitor symptoms and return if they become more bothersome or if PSA levels increase.  2. Elevated PSA - He's PSA has stabilized  in the 5-6 range, which is consistent with his prostate size. A previous biopsy was negative. Plan to continue annual PSA monitoring through the primary care provider. If PSA levels increase or if there are significant changes in urinary symptoms, He should return for further evaluation.  Return in about 1 year (around 08/14/2024) for PSA monitoring with primary care,or sooner if symptoms worsen or PSA levels increase.  I have reviewed the above documentation for accuracy and completeness, and I agree with the above.   Vanna Scotland, MD    [facility] [address], [address] [place], [place] [address] [phone]

## 2023-09-11 ENCOUNTER — Other Ambulatory Visit: Payer: Self-pay | Admitting: Internal Medicine

## 2023-09-11 DIAGNOSIS — I10 Essential (primary) hypertension: Secondary | ICD-10-CM

## 2023-09-11 NOTE — Telephone Encounter (Signed)
 Requested Prescriptions  Pending Prescriptions Disp Refills   losartan (COZAAR) 100 MG tablet [Pharmacy Med Name: LOSARTAN POTASSIUM 100 MG TAB] 90 tablet 0    Sig: TAKE 1 TABLET BY MOUTH EVERY DAY     Cardiovascular:  Angiotensin Receptor Blockers Failed - 09/11/2023  4:12 PM      Failed - Cr in normal range and within 180 days    Creatinine, Ser  Date Value Ref Range Status  10/04/2022 1.34 (H) 0.76 - 1.27 mg/dL Final         Failed - K in normal range and within 180 days    Potassium  Date Value Ref Range Status  10/04/2022 4.5 3.5 - 5.2 mmol/L Final         Failed - Last BP in normal range    BP Readings from Last 1 Encounters:  08/15/23 (!) 181/105         Failed - Valid encounter within last 6 months    Recent Outpatient Visits   None     Future Appointments             In 4 weeks Judithann Graves, Nyoka Cowden, MD [facility] at [facility]            Passed - Patient is not pregnant

## 2023-10-10 ENCOUNTER — Ambulatory Visit (INDEPENDENT_AMBULATORY_CARE_PROVIDER_SITE_OTHER): Payer: Self-pay | Admitting: Internal Medicine

## 2023-10-10 ENCOUNTER — Encounter: Payer: Self-pay | Admitting: Internal Medicine

## 2023-10-10 VITALS — BP 126/79 | HR 66 | Ht 64.96 in | Wt 151.4 lb

## 2023-10-10 DIAGNOSIS — I10 Essential (primary) hypertension: Secondary | ICD-10-CM

## 2023-10-10 DIAGNOSIS — N183 Chronic kidney disease, stage 3 unspecified: Secondary | ICD-10-CM | POA: Diagnosis not present

## 2023-10-10 DIAGNOSIS — N401 Enlarged prostate with lower urinary tract symptoms: Secondary | ICD-10-CM | POA: Diagnosis not present

## 2023-10-10 DIAGNOSIS — Z0000 Encounter for general adult medical examination without abnormal findings: Secondary | ICD-10-CM

## 2023-10-10 DIAGNOSIS — E782 Mixed hyperlipidemia: Secondary | ICD-10-CM | POA: Diagnosis not present

## 2023-10-10 DIAGNOSIS — N2889 Other specified disorders of kidney and ureter: Secondary | ICD-10-CM

## 2023-10-10 DIAGNOSIS — N138 Other obstructive and reflux uropathy: Secondary | ICD-10-CM

## 2023-10-10 DIAGNOSIS — Z23 Encounter for immunization: Secondary | ICD-10-CM | POA: Diagnosis not present

## 2023-10-10 NOTE — Assessment & Plan Note (Signed)
 Will continue to monitor. Avoid nsaids and increase hydratioin

## 2023-10-10 NOTE — Assessment & Plan Note (Signed)
 Blood pressure is well controlled.  Current medications are losartan. Will continue same regimen along with efforts to limit dietary sodium.

## 2023-10-10 NOTE — Assessment & Plan Note (Signed)
 Noted on CT in 2017 - Urology follow up did not feel additional imaging was needed in 2018 This issues has not been addressed since. Will check renal function and UA

## 2023-10-10 NOTE — Assessment & Plan Note (Signed)
 Followed by Urology. Last PSA in March was stable. He continues on daily Cialis .

## 2023-10-10 NOTE — Patient Instructions (Signed)
 Go to pharmacy and get Prevnar 20 or you can make a nurse visit here.

## 2023-10-10 NOTE — Assessment & Plan Note (Signed)
 Managed with diet and exercise. The 10-year ASCVD risk score (Arnett DK, et al., 2019) is: 13.1%   Values used to calculate the score:     Age: 68 years     Sex: Male     Is Non-Hispanic African American: No     Diabetic: No     Tobacco smoker: No     Systolic Blood Pressure: 126 mmHg     Is BP treated: Yes     HDL Cholesterol: 55 mg/dL     Total Cholesterol: 168 mg/dL

## 2023-10-10 NOTE — Progress Notes (Signed)
 Date:  10/10/2023   Name:  Brandon Mathews   DOB:  April 16, 1956   MRN:  [id number]   Chief Complaint: Annual Exam (No other concerns) Brandon Mathews is a 68 y.o. male who presents today for his Complete Annual Exam. He feels well. He reports exercising goes to the gym 5 times a week, 45 minutes - 1 hour . He reports he is sleeping well.   Health Maintenance  Topic Date Due   Pneumonia Vaccine (1 of 2 - PCV) Never done   COVID-19 Vaccine (6 - 2024-25 season) 02/02/2023   Flu Shot  01/02/2024   Colon Cancer Screening  05/12/2028   DTaP/Tdap/Td vaccine (3 - Td or Tdap) 01/20/2029   Hepatitis C Screening  Completed   Zoster (Shingles) Vaccine  Completed   HPV Vaccine  Aged Out   Meningitis B Vaccine  Aged Out    Lab Results  Component Value Date   PSA1 2.6 11/28/2015   PSA 5.36 08/11/2023   PSA 4.33 01/21/2020    Hypertension This is a chronic problem. The problem is controlled. Pertinent negatives include no chest pain, headaches, palpitations or shortness of breath. Past treatments include angiotensin blockers. The current treatment provides significant improvement. Hypertensive end-organ damage includes kidney disease. There is no history of CAD/MI or CVA.    Review of Systems  Constitutional:  Negative for fatigue and unexpected weight change.  HENT:  Negative for nosebleeds and trouble swallowing.   Eyes:  Negative for visual disturbance.  Respiratory:  Negative for cough, chest tightness, shortness of breath and wheezing.   Cardiovascular:  Negative for chest pain, palpitations and leg swelling.  Gastrointestinal:  Negative for abdominal pain, constipation and diarrhea.  Genitourinary:  Negative for dysuria and hematuria.  Musculoskeletal:  Negative for arthralgias and joint swelling.  Skin:  Negative for rash.  Neurological:  Negative for dizziness, weakness, light-headedness and headaches.  Hematological:  Negative for adenopathy.  Psychiatric/Behavioral:   Negative for dysphoric mood and suicidal ideas. The patient is not nervous/anxious.      Lab Results  Component Value Date   NA 142 10/04/2022   K 4.5 10/04/2022   CO2 23 10/04/2022   GLUCOSE 91 10/04/2022   BUN 18 10/04/2022   CREATININE 1.34 (H) 10/04/2022   CALCIUM 9.2 10/04/2022   EGFR 59 (L) 10/04/2022   GFRNONAA 56 01/21/2020   Lab Results  Component Value Date   CHOL 168 10/04/2022   HDL 55 10/04/2022   LDLCALC 99 10/04/2022   TRIG 74 10/04/2022   CHOLHDL 3.1 10/04/2022   No results found for: "TSH" No results found for: "HGBA1C" Lab Results  Component Value Date   WBC 7.4 10/04/2022   HGB 13.2 10/04/2022   HCT 39.1 10/04/2022   MCV 101 (H) 10/04/2022   PLT 242 10/04/2022   Lab Results  Component Value Date   ALT 14 10/04/2022   AST 14 10/04/2022   ALKPHOS 55 10/04/2022   BILITOT 1.0 10/04/2022   No results found for: "25OHVITD2", "25OHVITD3", "VD25OH"   Patient Active Problem List   Diagnosis Date Noted   Elevated PSA measurement 08/15/2023   Primary osteoarthritis of left knee 10/04/2022   Chronic renal insufficiency, stage 3 (moderate) (HCC) 04/25/2019   Left renal mass 01/02/2017   ED (erectile dysfunction) of organic origin 12/22/2015   Acid reflux 12/22/2015   Adaptive colitis 12/22/2015   CA of skin 12/22/2015   BPH with obstruction/lower urinary tract symptoms 12/01/2015   Arthralgia  of hip 09/06/2015   Other long term (current) drug therapy 12/29/2014   Degeneration of intervertebral disc of lumbar region 12/23/2013   Dysphagia 12/23/2013   Benign essential HTN 12/23/2013   Hyperlipidemia 12/23/2013   H/O Malignant melanoma 07/04/2012    No Known Allergies  Past Surgical History:  Procedure Laterality Date   MOHS SURGERY      Social History   Tobacco Use   Smoking status: Former   Smokeless tobacco: Never   Tobacco comments:    smoked for 25 years but has quit for 15 years   Substance Use Topics   Alcohol use: Yes     Alcohol/week: 0.0 standard drinks of alcohol    Comment: social   Drug use: No     Medication list has been reviewed and updated.  Current Meds  Medication Sig   clobetasol ointment (TEMOVATE) 0.05 % Apply 1 Application topically 2 (two) times daily.   losartan  (COZAAR ) 100 MG tablet TAKE 1 TABLET BY MOUTH EVERY DAY   Multiple Vitamin (MULTI-VITAMINS) TABS Take by mouth. Reported on 11/28/2015   tadalafil  (CIALIS ) 20 MG tablet Take 1 tablet (20 mg total) by mouth daily as needed for erectile dysfunction. Reported on 12/22/2015       10/10/2023    8:28 AM 04/08/2023    9:27 AM 10/04/2022    8:46 AM 05/13/2022    4:04 PM  GAD 7 : Generalized Anxiety Score  Nervous, Anxious, on Edge 1 0 1 0  Control/stop worrying 1 1 1  0  Worry too much - different things 1 1 1 1   Trouble relaxing 1 1 0 0  Restless 0 0 0 0  Easily annoyed or irritable 1 1 1  0  Afraid - awful might happen 0 0 0 0  Total GAD 7 Score 5 4 4 1   Anxiety Difficulty  Somewhat difficult Not difficult at all Not difficult at all       10/10/2023    8:28 AM 04/08/2023    9:27 AM 10/04/2022    8:46 AM  Depression screen PHQ 2/9  Decreased Interest 0 0 0  Down, Depressed, Hopeless 0 0 0  PHQ - 2 Score 0 0 0  Altered sleeping 0 0 1  Tired, decreased energy 1 1 1   Change in appetite 0 0 0  Feeling bad or failure about yourself  0 0 0  Trouble concentrating 1 0 1  Moving slowly or fidgety/restless 0 0 0  Suicidal thoughts 0 0 0  PHQ-9 Score 2 1 3   Difficult doing work/chores Not difficult at all Not difficult at all Not difficult at all    BP Readings from Last 3 Encounters:  10/10/23 126/79  08/15/23 (!) 181/105  04/18/23 122/74    Physical Exam Vitals and nursing note reviewed.  Constitutional:      Appearance: Normal appearance. He is well-developed.  HENT:     Head: Normocephalic.     Right Ear: Tympanic membrane, ear canal and external ear normal.     Left Ear: Tympanic membrane, ear canal and external ear  normal.     Nose: Nose normal.  Eyes:     Conjunctiva/sclera: Conjunctivae normal.     Pupils: Pupils are equal, round, and reactive to light.  Neck:     Thyroid: No thyromegaly.     Vascular: No carotid bruit.  Cardiovascular:     Rate and Rhythm: Normal rate and regular rhythm.     Heart sounds: Normal heart sounds.  Pulmonary:     Effort: Pulmonary effort is normal.     Breath sounds: Normal breath sounds. No wheezing.  Chest:  Breasts:    Right: No mass.     Left: No mass.  Abdominal:     General: Bowel sounds are normal.     Palpations: Abdomen is soft.     Tenderness: There is no abdominal tenderness.  Musculoskeletal:        General: Normal range of motion.     Cervical back: Normal range of motion and neck supple.     Right lower leg: No edema.     Left lower leg: No edema.  Lymphadenopathy:     Cervical: No cervical adenopathy.  Skin:    General: Skin is warm and dry.     Capillary Refill: Capillary refill takes less than 2 seconds.  Neurological:     General: No focal deficit present.     Mental Status: He is alert and oriented to person, place, and time.     Deep Tendon Reflexes: Reflexes are normal and symmetric.  Psychiatric:        Attention and Perception: Attention normal.        Mood and Affect: Mood normal.        Thought Content: Thought content normal.     Wt Readings from Last 3 Encounters:  10/10/23 151 lb 6 oz (68.7 kg)  08/15/23 155 lb 9.6 oz (70.6 kg)  04/18/23 151 lb (68.5 kg)    BP 126/79   Pulse 66   Ht 5' 4.96" (1.65 m)   Wt 151 lb 6 oz (68.7 kg)   SpO2 94%   BMI 25.22 kg/m   Assessment and Plan:  Problem List Items Addressed This Visit       Unprioritized   Benign essential HTN (Chronic)   Blood pressure is well controlled.  Current medications are losartan . Will continue same regimen along with efforts to limit dietary sodium.       Relevant Orders   CBC with Differential/Platelet   Comprehensive metabolic panel  with GFR   TSH   Urinalysis, Routine w reflex microscopic   Chronic renal insufficiency, stage 3 (moderate) (HCC) (Chronic)   Will continue to monitor. Avoid nsaids and increase hydratioin      Relevant Orders   Comprehensive metabolic panel with GFR   BPH with obstruction/lower urinary tract symptoms   Followed by Urology. Last PSA in March was stable. He continues on daily Cialis .      Hyperlipidemia   Managed with diet and exercise. The 10-year ASCVD risk score (Arnett DK, et al., 2019) is: 13.1%   Values used to calculate the score:     Age: 66 years     Sex: Male     Is Non-Hispanic African American: No     Diabetic: No     Tobacco smoker: No     Systolic Blood Pressure: 126 mmHg     Is BP treated: Yes     HDL Cholesterol: 55 mg/dL     Total Cholesterol: 168 mg/dL       Relevant Orders   Lipid panel   Left renal mass   Noted on CT in 2017 - Urology follow up did not feel additional imaging was needed in 2018 This issues has not been addressed since. Will check renal function and UA      Other Visit Diagnoses       Annual physical exam    -  Primary   up to date on screenings due for Prevnar but deferred today due to busy weekend at work     Immunization due           Return in about 6 months (around 04/11/2024) for HTN.    Sheron Dixons, MD [facility] [place]

## 2023-10-11 LAB — URINALYSIS, ROUTINE W REFLEX MICROSCOPIC
Bilirubin, UA: NEGATIVE
Glucose, UA: NEGATIVE
Ketones, UA: NEGATIVE
Leukocytes,UA: NEGATIVE
Nitrite, UA: NEGATIVE
RBC, UA: NEGATIVE
Specific Gravity, UA: 1.026 (ref 1.005–1.030)
Urobilinogen, Ur: 0.2 mg/dL (ref 0.2–1.0)
pH, UA: 5.5 (ref 5.0–7.5)

## 2023-10-11 LAB — COMPREHENSIVE METABOLIC PANEL WITH GFR
ALT: 15 IU/L (ref 0–44)
AST: 15 IU/L (ref 0–40)
Albumin: 4.4 g/dL (ref 3.9–4.9)
Alkaline Phosphatase: 50 IU/L (ref 44–121)
BUN/Creatinine Ratio: 18 (ref 10–24)
BUN: 21 mg/dL (ref 8–27)
Bilirubin Total: 0.8 mg/dL (ref 0.0–1.2)
CO2: 25 mmol/L (ref 20–29)
Calcium: 9.2 mg/dL (ref 8.6–10.2)
Chloride: 103 mmol/L (ref 96–106)
Creatinine, Ser: 1.14 mg/dL (ref 0.76–1.27)
Globulin, Total: 2.5 g/dL (ref 1.5–4.5)
Glucose: 80 mg/dL (ref 70–99)
Potassium: 4.2 mmol/L (ref 3.5–5.2)
Sodium: 140 mmol/L (ref 134–144)
Total Protein: 6.9 g/dL (ref 6.0–8.5)
eGFR: 71 mL/min/{1.73_m2} (ref >59)

## 2023-10-11 LAB — CBC WITH DIFFERENTIAL/PLATELET
Basophils Absolute: 0 10*3/uL (ref 0.0–0.2)
Basos: 1 %
EOS (ABSOLUTE): 0.1 10*3/uL (ref 0.0–0.4)
Eos: 2 %
Hematocrit: 37.7 % (ref 37.5–51.0)
Hemoglobin: 12.8 g/dL — ABNORMAL LOW (ref 13.0–17.7)
Immature Grans (Abs): 0 10*3/uL (ref 0.0–0.1)
Immature Granulocytes: 0 %
Lymphocytes Absolute: 2 10*3/uL (ref 0.7–3.1)
Lymphs: 29 %
MCH: 34.8 pg — ABNORMAL HIGH (ref 26.6–33.0)
MCHC: 34 g/dL (ref 31.5–35.7)
MCV: 102 fL — ABNORMAL HIGH (ref 79–97)
Monocytes Absolute: 0.4 10*3/uL (ref 0.1–0.9)
Monocytes: 6 %
Neutrophils Absolute: 4.2 10*3/uL (ref 1.4–7.0)
Neutrophils: 62 %
Platelets: 256 10*3/uL (ref 150–450)
RBC: 3.68 x10E6/uL — ABNORMAL LOW (ref 4.14–5.80)
RDW: 11.8 % (ref 11.6–15.4)
WBC: 6.8 10*3/uL (ref 3.4–10.8)

## 2023-10-11 LAB — LIPID PANEL
Chol/HDL Ratio: 3.2 ratio (ref 0.0–5.0)
Cholesterol, Total: 171 mg/dL (ref 100–199)
HDL: 53 mg/dL (ref >39)
LDL Chol Calc (NIH): 100 mg/dL — ABNORMAL HIGH (ref 0–99)
Triglycerides: 97 mg/dL (ref 0–149)
VLDL Cholesterol Cal: 18 mg/dL (ref 5–40)

## 2023-10-11 LAB — TSH: TSH: 2.01 u[IU]/mL (ref 0.450–4.500)

## 2023-10-12 ENCOUNTER — Encounter: Payer: Self-pay | Admitting: Internal Medicine

## 2023-10-12 ENCOUNTER — Other Ambulatory Visit: Payer: Self-pay | Admitting: Internal Medicine

## 2023-10-12 DIAGNOSIS — D539 Nutritional anemia, unspecified: Secondary | ICD-10-CM | POA: Insufficient documentation

## 2023-10-12 NOTE — Progress Notes (Unsigned)
 Date:  10/12/2023   Name:  Brandon Mathews   DOB:  1955/12/13   MRN:  [id number]   Chief Complaint: No chief complaint on file.  HPI  Review of Systems   Lab Results  Component Value Date   NA 140 10/10/2023   K 4.2 10/10/2023   CO2 25 10/10/2023   GLUCOSE 80 10/10/2023   BUN 21 10/10/2023   CREATININE 1.14 10/10/2023   CALCIUM 9.2 10/10/2023   EGFR 71 10/10/2023   GFRNONAA 56 01/21/2020   Lab Results  Component Value Date   CHOL 171 10/10/2023   HDL 53 10/10/2023   LDLCALC 100 (H) 10/10/2023   TRIG 97 10/10/2023   CHOLHDL 3.2 10/10/2023   Lab Results  Component Value Date   TSH 2.010 10/10/2023   No results found for: "HGBA1C" Lab Results  Component Value Date   WBC 6.8 10/10/2023   HGB 12.8 (L) 10/10/2023   HCT 37.7 10/10/2023   MCV 102 (H) 10/10/2023   PLT 256 10/10/2023   Lab Results  Component Value Date   ALT 15 10/10/2023   AST 15 10/10/2023   ALKPHOS 50 10/10/2023   BILITOT 0.8 10/10/2023   No results found for: "25OHVITD2", "25OHVITD3", "VD25OH"   Patient Active Problem List   Diagnosis Date Noted   Elevated PSA measurement 08/15/2023   Primary osteoarthritis of left knee 10/04/2022   Chronic renal insufficiency, stage 3 (moderate) (HCC) 04/25/2019   Left renal mass 01/02/2017   ED (erectile dysfunction) of organic origin 12/22/2015   Acid reflux 12/22/2015   Adaptive colitis 12/22/2015   CA of skin 12/22/2015   BPH with obstruction/lower urinary tract symptoms 12/01/2015   Arthralgia of hip 09/06/2015   Other long term (current) drug therapy 12/29/2014   Degeneration of intervertebral disc of lumbar region 12/23/2013   Dysphagia 12/23/2013   Benign essential HTN 12/23/2013   Hyperlipidemia 12/23/2013   H/O Malignant melanoma 07/04/2012    No Known Allergies  Past Surgical History:  Procedure Laterality Date   MOHS SURGERY      Social History   Tobacco Use   Smoking status: Former   Smokeless tobacco: Never   Tobacco  comments:    smoked for 25 years but has quit for 15 years   Substance Use Topics   Alcohol use: Yes    Alcohol/week: 0.0 standard drinks of alcohol    Comment: social   Drug use: No     Medication list has been reviewed and updated.  No outpatient medications have been marked as taking for the 10/12/23 encounter (Orders Only) with Sheron Dixons, MD.       10/10/2023    8:28 AM 04/08/2023    9:27 AM 10/04/2022    8:46 AM 05/13/2022    4:04 PM  GAD 7 : Generalized Anxiety Score  Nervous, Anxious, on Edge 1 0 1 0  Control/stop worrying 1 1 1  0  Worry too much - different things 1 1 1 1   Trouble relaxing 1 1 0 0  Restless 0 0 0 0  Easily annoyed or irritable 1 1 1  0  Afraid - awful might happen 0 0 0 0  Total GAD 7 Score 5 4 4 1   Anxiety Difficulty  Somewhat difficult Not difficult at all Not difficult at all       10/10/2023    8:28 AM 04/08/2023    9:27 AM 10/04/2022    8:46 AM  Depression screen PHQ 2/9  Decreased Interest 0 0 0  Down, Depressed, Hopeless 0 0 0  PHQ - 2 Score 0 0 0  Altered sleeping 0 0 1  Tired, decreased energy 1 1 1   Change in appetite 0 0 0  Feeling bad or failure about yourself  0 0 0  Trouble concentrating 1 0 1  Moving slowly or fidgety/restless 0 0 0  Suicidal thoughts 0 0 0  PHQ-9 Score 2 1 3   Difficult doing work/chores Not difficult at all Not difficult at all Not difficult at all    BP Readings from Last 3 Encounters:  10/10/23 126/79  08/15/23 (!) 181/105  04/18/23 122/74    Physical Exam  Wt Readings from Last 3 Encounters:  10/10/23 151 lb 6 oz (68.7 kg)  08/15/23 155 lb 9.6 oz (70.6 kg)  04/18/23 151 lb (68.5 kg)    There were no vitals taken for this visit.  Assessment and Plan:  Problem List Items Addressed This Visit   None   No follow-ups on file.    Sheron Dixons, MD [facility] [place]

## 2023-12-09 ENCOUNTER — Other Ambulatory Visit: Payer: Self-pay | Admitting: Internal Medicine

## 2023-12-09 DIAGNOSIS — I10 Essential (primary) hypertension: Secondary | ICD-10-CM

## 2023-12-11 NOTE — Telephone Encounter (Signed)
 Requested Prescriptions  Pending Prescriptions Disp Refills   losartan  (COZAAR ) 100 MG tablet [Pharmacy Med Name: LOSARTAN  POTASSIUM 100 MG TAB] 90 tablet 0    Sig: TAKE 1 TABLET BY MOUTH EVERY DAY     Cardiovascular:  Angiotensin Receptor Blockers Passed - 12/11/2023 10:24 AM      Passed - Cr in normal range and within 180 days    Creatinine, Ser  Date Value Ref Range Status  10/10/2023 1.14 0.76 - 1.27 mg/dL Final         Passed - K in normal range and within 180 days    Potassium  Date Value Ref Range Status  10/10/2023 4.2 3.5 - 5.2 mmol/L Final         Passed - Patient is not pregnant      Passed - Last BP in normal range    BP Readings from Last 1 Encounters:  10/10/23 126/79         Passed - Valid encounter within last 6 months    Recent Outpatient Visits           2 months ago Annual physical exam   [facility] at [facility], Leita DEL, MD

## 2024-01-19 ENCOUNTER — Telehealth: Payer: Self-pay

## 2024-01-19 NOTE — Telephone Encounter (Signed)
 Copied from CRM #[id number]. Topic: Clinical - Request for Lab/Test Order >> Jan 19, 2024 12:29 PM Precious C wrote: Reason for CRM: Pt called in stating that his provider requested for him to for blood work order. Patient is requesting confirmation or a follow up regarding this, either via MyChart or by telephone. Callback number is [phone].

## 2024-01-20 ENCOUNTER — Other Ambulatory Visit: Payer: Self-pay

## 2024-01-20 DIAGNOSIS — D539 Nutritional anemia, unspecified: Secondary | ICD-10-CM

## 2024-01-20 NOTE — Telephone Encounter (Signed)
 Called pt. Ordered B12 and CBC w/ Diff.   - CM

## 2024-01-21 ENCOUNTER — Ambulatory Visit: Payer: Self-pay | Admitting: Internal Medicine

## 2024-01-21 LAB — CBC WITH DIFFERENTIAL/PLATELET
Basophils Absolute: 0 x10E3/uL (ref 0.0–0.2)
Basos: 1 %
EOS (ABSOLUTE): 0.1 x10E3/uL (ref 0.0–0.4)
Eos: 2 %
Hematocrit: 41.3 % (ref 37.5–51.0)
Hemoglobin: 13.5 g/dL (ref 13.0–17.7)
Immature Grans (Abs): 0 x10E3/uL (ref 0.0–0.1)
Immature Granulocytes: 0 %
Lymphocytes Absolute: 2.2 x10E3/uL (ref 0.7–3.1)
Lymphs: 41 %
MCH: 33.3 pg — ABNORMAL HIGH (ref 26.6–33.0)
MCHC: 32.7 g/dL (ref 31.5–35.7)
MCV: 102 fL — ABNORMAL HIGH (ref 79–97)
Monocytes Absolute: 0.3 x10E3/uL (ref 0.1–0.9)
Monocytes: 5 %
Neutrophils Absolute: 2.8 x10E3/uL (ref 1.4–7.0)
Neutrophils: 51 %
Platelets: 255 x10E3/uL (ref 150–450)
RBC: 4.05 x10E6/uL — ABNORMAL LOW (ref 4.14–5.80)
RDW: 11.3 % — ABNORMAL LOW (ref 11.6–15.4)
WBC: 5.5 x10E3/uL (ref 3.4–10.8)

## 2024-01-21 LAB — B12 AND FOLATE PANEL
Folate: >20 ng/mL (ref >3.0)
Vitamin B-12: 761 pg/mL (ref 232–1245)

## 2024-04-02 ENCOUNTER — Other Ambulatory Visit: Payer: Self-pay | Admitting: Internal Medicine

## 2024-04-02 DIAGNOSIS — I10 Essential (primary) hypertension: Secondary | ICD-10-CM

## 2024-04-03 NOTE — Telephone Encounter (Signed)
 Requested Prescriptions  Pending Prescriptions Disp Refills   losartan  (COZAAR ) 100 MG tablet [Pharmacy Med Name: LOSARTAN  POTASSIUM 100 MG TAB] 90 tablet 0    Sig: TAKE 1 TABLET BY MOUTH EVERY DAY     Cardiovascular:  Angiotensin Receptor Blockers Passed - 04/03/2024  8:45 AM      Passed - Cr in normal range and within 180 days    Creatinine, Ser  Date Value Ref Range Status  10/10/2023 1.14 0.76 - 1.27 mg/dL Final         Passed - K in normal range and within 180 days    Potassium  Date Value Ref Range Status  10/10/2023 4.2 3.5 - 5.2 mmol/L Final         Passed - Patient is not pregnant      Passed - Last BP in normal range    BP Readings from Last 1 Encounters:  10/10/23 126/79         Passed - Valid encounter within last 6 months    Recent Outpatient Visits           5 months ago Annual physical exam   [facility] at [facility], Leita DEL, MD

## 2024-05-07 ENCOUNTER — Ambulatory Visit: Admitting: Internal Medicine

## 2024-05-07 ENCOUNTER — Encounter: Payer: Self-pay | Admitting: Internal Medicine

## 2024-05-07 VITALS — BP 122/78 | HR 74 | Ht 64.96 in | Wt 155.0 lb

## 2024-05-07 DIAGNOSIS — M25559 Pain in unspecified hip: Secondary | ICD-10-CM | POA: Diagnosis not present

## 2024-05-07 DIAGNOSIS — I10 Essential (primary) hypertension: Secondary | ICD-10-CM | POA: Diagnosis not present

## 2024-05-07 MED ORDER — LOSARTAN POTASSIUM 100 MG PO TABS: 100.0000 mg | ORAL_TABLET | Freq: Every day | ORAL | 1 refills | Status: AC

## 2024-05-07 NOTE — Progress Notes (Addendum)
 Date:  05/07/2024   Name:  Brandon Mathews   DOB:  1956/02/04   MRN:  [id number]   Chief Complaint: Hypertension  Hypertension This is a chronic problem. The problem is controlled. Pertinent negatives include no chest pain, headaches, palpitations or shortness of breath. Past treatments include angiotensin blockers. The current treatment provides significant improvement. There is no history of angina, kidney disease, CAD/MI or CVA.    Review of Systems  Constitutional:  Negative for fatigue and unexpected weight change.  Eyes:  Negative for visual disturbance.  Respiratory:  Negative for cough, chest tightness, shortness of breath and wheezing.   Cardiovascular:  Negative for chest pain, palpitations and leg swelling.  Gastrointestinal:  Negative for abdominal pain, constipation and diarrhea.  Musculoskeletal:  Positive for arthralgias (mild hip pain).  Neurological:  Negative for dizziness, weakness, light-headedness and headaches.  Psychiatric/Behavioral:  Negative for dysphoric mood and sleep disturbance. The patient is not nervous/anxious.      Lab Results  Component Value Date   NA 140 10/10/2023   K 4.2 10/10/2023   CO2 25 10/10/2023   GLUCOSE 80 10/10/2023   BUN 21 10/10/2023   CREATININE 1.14 10/10/2023   CALCIUM 9.2 10/10/2023   EGFR 71 10/10/2023   GFRNONAA 56 01/21/2020   Lab Results  Component Value Date   CHOL 171 10/10/2023   HDL 53 10/10/2023   LDLCALC 100 (H) 10/10/2023   TRIG 97 10/10/2023   CHOLHDL 3.2 10/10/2023   Lab Results  Component Value Date   TSH 2.010 10/10/2023   No results found for: HGBA1C Lab Results  Component Value Date   WBC 5.5 01/20/2024   HGB 13.5 01/20/2024   HCT 41.3 01/20/2024   MCV 102 (H) 01/20/2024   PLT 255 01/20/2024   Lab Results  Component Value Date   ALT 15 10/10/2023   AST 15 10/10/2023   ALKPHOS 50 10/10/2023   BILITOT 0.8 10/10/2023   No results found for: MARIEN BOLLS, VD25OH    Patient Active Problem List   Diagnosis Date Noted   Macrocytic anemia 10/12/2023   Elevated PSA measurement 08/15/2023   Primary osteoarthritis of left knee 10/04/2022   Chronic renal insufficiency, stage 3 (moderate) 04/25/2019   Left renal mass 01/02/2017   ED (erectile dysfunction) of organic origin 12/22/2015   Acid reflux 12/22/2015   Adaptive colitis 12/22/2015   CA of skin 12/22/2015   BPH with obstruction/lower urinary tract symptoms 12/01/2015   Arthralgia of hip 09/06/2015   Other long term (current) drug therapy 12/29/2014   Degeneration of intervertebral disc of lumbar region 12/23/2013   Dysphagia 12/23/2013   Benign essential HTN 12/23/2013   Hyperlipidemia 12/23/2013   H/O Malignant melanoma 07/04/2012    No Known Allergies  Past Surgical History:  Procedure Laterality Date   MOHS SURGERY      Social History   Tobacco Use   Smoking status: Former   Smokeless tobacco: Never   Tobacco comments:    smoked for 25 years but has quit for 15 years   Substance Use Topics   Alcohol use: Yes    Alcohol/week: 0.0 standard drinks of alcohol    Comment: social   Drug use: No     Medication list has been reviewed and updated.  Current Meds  Medication Sig   clobetasol ointment (TEMOVATE) 0.05 % Apply 1 Application topically 2 (two) times daily.   Multiple Vitamin (MULTI-VITAMINS) TABS Take by mouth. Reported on 11/28/2015   tadalafil  (  CIALIS ) 20 MG tablet Take 1 tablet (20 mg total) by mouth daily as needed for erectile dysfunction. Reported on 12/22/2015   [DISCONTINUED] losartan  (COZAAR ) 100 MG tablet TAKE 1 TABLET BY MOUTH EVERY DAY       05/07/2024    8:07 AM 10/10/2023    8:28 AM 04/08/2023    9:27 AM 10/04/2022    8:46 AM  GAD 7 : Generalized Anxiety Score  Nervous, Anxious, on Edge 1 1 0 1  Control/stop worrying 1 1 1 1   Worry too much - different things 1 1 1 1   Trouble relaxing 0 1 1 0  Restless 0 0 0 0  Easily annoyed or irritable 0 1 1 1    Afraid - awful might happen 0 0 0 0  Total GAD 7 Score 3 5 4 4   Anxiety Difficulty Somewhat difficult  Somewhat difficult Not difficult at all       05/07/2024    8:07 AM 10/10/2023    8:28 AM 04/08/2023    9:27 AM  Depression screen PHQ 2/9  Decreased Interest 0 0 0  Down, Depressed, Hopeless 0 0 0  PHQ - 2 Score 0 0 0  Altered sleeping 0 0 0  Tired, decreased energy 0 1 1  Change in appetite 0 0 0  Feeling bad or failure about yourself  0 0 0  Trouble concentrating 0 1 0  Moving slowly or fidgety/restless 0 0 0  Suicidal thoughts 0 0 0  PHQ-9 Score 0 2  1   Difficult doing work/chores Not difficult at all Not difficult at all Not difficult at all     Data saved with a previous flowsheet row definition    BP Readings from Last 3 Encounters:  05/07/24 122/78  10/10/23 126/79  08/15/23 (!) 181/105    Physical Exam Vitals and nursing note reviewed.  Constitutional:      General: He is not in acute distress.    Appearance: Normal appearance. He is well-developed.  HENT:     Head: Normocephalic and atraumatic.  Cardiovascular:     Rate and Rhythm: Normal rate and regular rhythm.  Pulmonary:     Effort: Pulmonary effort is normal. No respiratory distress.     Breath sounds: No wheezing or rhonchi.  Musculoskeletal:     Cervical back: Normal range of motion.     Right lower leg: No edema.     Left lower leg: No edema.  Lymphadenopathy:     Cervical: No cervical adenopathy.  Skin:    General: Skin is warm and dry.     Findings: No rash.  Neurological:     Mental Status: He is alert and oriented to person, place, and time.  Psychiatric:        Mood and Affect: Mood normal.        Behavior: Behavior normal.     Wt Readings from Last 3 Encounters:  05/07/24 155 lb (70.3 kg)  10/10/23 151 lb 6 oz (68.7 kg)  08/15/23 155 lb 9.6 oz (70.6 kg)    BP 122/78   Pulse 74   Ht 5' 4.96 (1.65 m)   Wt 155 lb (70.3 kg)   SpO2 94%   BMI 25.83 kg/m   Assessment and  Plan:  Problem List Items Addressed This Visit       Unprioritized   Benign essential HTN - Primary (Chronic)   Well controlled blood pressure today. Current regimen is losartan  100 mg. No medication side effects  noted.        Relevant Medications   losartan  (COZAAR ) 100 MG tablet   Arthralgia of hip   Mild discomfort - not currently limiting activities He still works and goes to the gym regularly      Schedule nurse visit for Flu and Prevnar vaccines No follow-ups on file.    Leita HILARIO Adie, MD [facility] [place]

## 2024-05-07 NOTE — Assessment & Plan Note (Signed)
 Mild discomfort - not currently limiting activities He still works and goes to the gym regularly

## 2024-05-07 NOTE — Assessment & Plan Note (Signed)
 Well controlled blood pressure today. Current regimen is losartan  100 mg. No medication side effects noted.

## 2024-05-10 ENCOUNTER — Ambulatory Visit

## 2024-05-10 DIAGNOSIS — Z23 Encounter for immunization: Secondary | ICD-10-CM | POA: Diagnosis not present

## 2024-05-10 NOTE — Addendum Note (Signed)
 Addended by: CORALEE STEVA SAILOR on: 05/10/2024 08:16 AM   Modules accepted: Orders

## 2024-05-10 NOTE — Progress Notes (Signed)
 Patient is in office today for a nurse visit for Immunization. Patient Injection was given in the  Left deltoid. Patient tolerated injection well.

## 2024-08-13 ENCOUNTER — Ambulatory Visit: Admitting: Urology

## 2024-10-08 ENCOUNTER — Encounter: Admitting: Student
